# Patient Record
Sex: Male | Born: 1975 | Race: White | Hispanic: No | Smoking: Never smoker
Health system: Southern US, Community
[De-identification: ages and names within clinical notes are randomized; demographics above are authoritative.]

## PROBLEM LIST (undated history)

## (undated) DIAGNOSIS — F32A Depression, unspecified: Secondary | ICD-10-CM

## (undated) DIAGNOSIS — J45909 Unspecified asthma, uncomplicated: Secondary | ICD-10-CM

## (undated) DIAGNOSIS — F1010 Alcohol abuse, uncomplicated: Secondary | ICD-10-CM

## (undated) DIAGNOSIS — F329 Major depressive disorder, single episode, unspecified: Secondary | ICD-10-CM

## (undated) DIAGNOSIS — G4733 Obstructive sleep apnea (adult) (pediatric): Secondary | ICD-10-CM

## (undated) DIAGNOSIS — E785 Hyperlipidemia, unspecified: Secondary | ICD-10-CM

## (undated) HISTORY — DX: Major depressive disorder, single episode, unspecified: F32.9

## (undated) HISTORY — DX: Depression, unspecified: F32.A

## (undated) HISTORY — DX: Unspecified asthma, uncomplicated: J45.909

## (undated) HISTORY — DX: Obstructive sleep apnea (adult) (pediatric): G47.33

## (undated) HISTORY — DX: Alcohol abuse, uncomplicated: F10.10

## (undated) HISTORY — DX: Hyperlipidemia, unspecified: E78.5

## (undated) HISTORY — PX: OTHER SURGICAL HISTORY: SHX169

---

## 2004-09-04 ENCOUNTER — Emergency Department (HOSPITAL_COMMUNITY): Admission: EM | Admit: 2004-09-04 | Discharge: 2004-09-04 | Payer: Self-pay | Admitting: Family Medicine

## 2005-11-29 ENCOUNTER — Emergency Department (HOSPITAL_COMMUNITY): Admission: EM | Admit: 2005-11-29 | Discharge: 2005-11-29 | Payer: Self-pay | Admitting: Family Medicine

## 2006-01-09 ENCOUNTER — Emergency Department (HOSPITAL_COMMUNITY): Admission: EM | Admit: 2006-01-09 | Discharge: 2006-01-09 | Payer: Self-pay | Admitting: Family Medicine

## 2008-05-15 ENCOUNTER — Emergency Department (HOSPITAL_COMMUNITY): Admission: EM | Admit: 2008-05-15 | Discharge: 2008-05-15 | Payer: Self-pay | Admitting: Emergency Medicine

## 2010-12-28 ENCOUNTER — Institutional Professional Consult (permissible substitution) (INDEPENDENT_AMBULATORY_CARE_PROVIDER_SITE_OTHER): Payer: BC Managed Care – PPO | Admitting: Cardiology

## 2010-12-28 DIAGNOSIS — R9431 Abnormal electrocardiogram [ECG] [EKG]: Secondary | ICD-10-CM

## 2010-12-28 DIAGNOSIS — R079 Chest pain, unspecified: Secondary | ICD-10-CM

## 2010-12-29 ENCOUNTER — Other Ambulatory Visit (HOSPITAL_COMMUNITY): Payer: Self-pay | Admitting: Cardiology

## 2010-12-30 ENCOUNTER — Ambulatory Visit (HOSPITAL_COMMUNITY): Payer: BC Managed Care – PPO | Attending: Cardiology | Admitting: Radiology

## 2010-12-30 DIAGNOSIS — R072 Precordial pain: Secondary | ICD-10-CM

## 2010-12-30 DIAGNOSIS — E669 Obesity, unspecified: Secondary | ICD-10-CM | POA: Insufficient documentation

## 2011-01-05 ENCOUNTER — Telehealth: Payer: Self-pay | Admitting: *Deleted

## 2011-01-05 NOTE — Telephone Encounter (Signed)
Notified of echo results; faxed to Dr. Lupe Carney

## 2011-08-12 HISTORY — PX: NASAL SEPTUM SURGERY: SHX37

## 2012-01-12 ENCOUNTER — Encounter: Payer: Self-pay | Admitting: *Deleted

## 2013-03-07 ENCOUNTER — Emergency Department (HOSPITAL_COMMUNITY)
Admission: EM | Admit: 2013-03-07 | Discharge: 2013-03-07 | Disposition: A | Discharge status: Other Acute Inpatient Hospital | Payer: BC Managed Care – PPO | Attending: Emergency Medicine | Admitting: Emergency Medicine

## 2013-03-07 ENCOUNTER — Inpatient Hospital Stay (HOSPITAL_COMMUNITY)
Admission: AD | Admit: 2013-03-07 | Discharge: 2013-03-10 | DRG: 751 | Disposition: A | Discharge status: Home / Self Care | Payer: BC Managed Care – PPO | Source: Intra-hospital | Attending: Psychiatry | Admitting: Psychiatry

## 2013-03-07 ENCOUNTER — Ambulatory Visit (INDEPENDENT_AMBULATORY_CARE_PROVIDER_SITE_OTHER): Payer: BC Managed Care – PPO | Admitting: Family Medicine

## 2013-03-07 ENCOUNTER — Encounter (HOSPITAL_COMMUNITY): Payer: Self-pay | Admitting: *Deleted

## 2013-03-07 ENCOUNTER — Encounter: Payer: Self-pay | Admitting: Family Medicine

## 2013-03-07 VITALS — BP 124/98 | Temp 97.7°F | Ht 77.0 in | Wt 290.0 lb

## 2013-03-07 DIAGNOSIS — F314 Bipolar disorder, current episode depressed, severe, without psychotic features: Secondary | ICD-10-CM

## 2013-03-07 DIAGNOSIS — Z79899 Other long term (current) drug therapy: Secondary | ICD-10-CM | POA: Insufficient documentation

## 2013-03-07 DIAGNOSIS — F1910 Other psychoactive substance abuse, uncomplicated: Secondary | ICD-10-CM

## 2013-03-07 DIAGNOSIS — F1020 Alcohol dependence, uncomplicated: Secondary | ICD-10-CM | POA: Diagnosis present

## 2013-03-07 DIAGNOSIS — F1010 Alcohol abuse, uncomplicated: Secondary | ICD-10-CM | POA: Insufficient documentation

## 2013-03-07 DIAGNOSIS — F332 Major depressive disorder, recurrent severe without psychotic features: Secondary | ICD-10-CM | POA: Diagnosis present

## 2013-03-07 DIAGNOSIS — Z8639 Personal history of other endocrine, nutritional and metabolic disease: Secondary | ICD-10-CM | POA: Insufficient documentation

## 2013-03-07 DIAGNOSIS — F329 Major depressive disorder, single episode, unspecified: Secondary | ICD-10-CM | POA: Insufficient documentation

## 2013-03-07 DIAGNOSIS — Z8659 Personal history of other mental and behavioral disorders: Secondary | ICD-10-CM | POA: Insufficient documentation

## 2013-03-07 DIAGNOSIS — Z862 Personal history of diseases of the blood and blood-forming organs and certain disorders involving the immune mechanism: Secondary | ICD-10-CM | POA: Insufficient documentation

## 2013-03-07 DIAGNOSIS — F32A Depression, unspecified: Secondary | ICD-10-CM

## 2013-03-07 DIAGNOSIS — F3289 Other specified depressive episodes: Secondary | ICD-10-CM | POA: Insufficient documentation

## 2013-03-07 DIAGNOSIS — J45909 Unspecified asthma, uncomplicated: Secondary | ICD-10-CM | POA: Insufficient documentation

## 2013-03-07 DIAGNOSIS — R45851 Suicidal ideations: Secondary | ICD-10-CM

## 2013-03-07 DIAGNOSIS — R259 Unspecified abnormal involuntary movements: Secondary | ICD-10-CM | POA: Insufficient documentation

## 2013-03-07 DIAGNOSIS — F322 Major depressive disorder, single episode, severe without psychotic features: Secondary | ICD-10-CM

## 2013-03-07 LAB — RAPID URINE DRUG SCREEN, HOSP PERFORMED
Amphetamines: NOT DETECTED
Barbiturates: NOT DETECTED
Benzodiazepines: POSITIVE — AB
Cocaine: NOT DETECTED
Opiates: NOT DETECTED
Tetrahydrocannabinol: NOT DETECTED

## 2013-03-07 LAB — CBC
HCT: 50 % (ref 39.0–52.0)
Hemoglobin: 17.8 g/dL — ABNORMAL HIGH (ref 13.0–17.0)
MCH: 29.9 pg (ref 26.0–34.0)
MCHC: 35.6 g/dL (ref 30.0–36.0)
MCV: 84 fL (ref 78.0–100.0)
Platelets: 243 10*3/uL (ref 150–400)
RBC: 5.95 MIL/uL — ABNORMAL HIGH (ref 4.22–5.81)
RDW: 15.1 % (ref 11.5–15.5)
WBC: 8.6 10*3/uL (ref 4.0–10.5)

## 2013-03-07 LAB — ETHANOL: Alcohol, Ethyl (B): <11 mg/dL (ref 0–11)

## 2013-03-07 LAB — COMPREHENSIVE METABOLIC PANEL
ALT: 32 U/L (ref 0–53)
AST: 31 U/L (ref 0–37)
Albumin: 3.5 g/dL (ref 3.5–5.2)
Alkaline Phosphatase: 66 U/L (ref 39–117)
BUN: 15 mg/dL (ref 6–23)
CO2: 28 mEq/L (ref 19–32)
Calcium: 9.6 mg/dL (ref 8.4–10.5)
Chloride: 102 mEq/L (ref 96–112)
Creatinine, Ser: 1.38 mg/dL — ABNORMAL HIGH (ref 0.50–1.35)
GFR calc Af Amer: 75 mL/min — ABNORMAL LOW (ref >90)
GFR calc non Af Amer: 65 mL/min — ABNORMAL LOW (ref >90)
Glucose, Bld: 109 mg/dL — ABNORMAL HIGH (ref 70–99)
Potassium: 4.1 mEq/L (ref 3.5–5.1)
Sodium: 139 mEq/L (ref 135–145)
Total Bilirubin: 0.6 mg/dL (ref 0.3–1.2)
Total Protein: 7.7 g/dL (ref 6.0–8.3)

## 2013-03-07 LAB — ACETAMINOPHEN LEVEL: Acetaminophen (Tylenol), Serum: <15 ug/mL (ref 10–30)

## 2013-03-07 LAB — SALICYLATE LEVEL: Salicylate Lvl: <2 mg/dL — ABNORMAL LOW (ref 2.8–20.0)

## 2013-03-07 MED ORDER — LORAZEPAM 1 MG PO TABS
1.0000 mg | ORAL_TABLET | Freq: Three times a day (TID) | ORAL | Status: DC | PRN
  Administered 2013-03-07: 1 mg via ORAL
  Filled 2013-03-07: qty 1

## 2013-03-07 MED ORDER — CHLORDIAZEPOXIDE HCL 25 MG PO CAPS
25.0000 mg | ORAL_CAPSULE | Freq: Four times a day (QID) | ORAL | Status: AC
  Administered 2013-03-08 (×4): 25 mg via ORAL
  Filled 2013-03-07 (×5): qty 1

## 2013-03-07 MED ORDER — ACETAMINOPHEN 325 MG PO TABS: 650.0000 mg | ORAL_TABLET | ORAL | Status: DC | PRN

## 2013-03-07 MED ORDER — CHLORDIAZEPOXIDE HCL 25 MG PO CAPS: 25.0000 mg | ORAL_CAPSULE | Freq: Four times a day (QID) | ORAL | Status: DC | PRN

## 2013-03-07 MED ORDER — CHLORDIAZEPOXIDE HCL 25 MG PO CAPS: 25.0000 mg | ORAL_CAPSULE | Freq: Every day | ORAL | Status: DC

## 2013-03-07 MED ORDER — CHLORDIAZEPOXIDE HCL 25 MG PO CAPS
25.0000 mg | ORAL_CAPSULE | Freq: Once | ORAL | Status: AC
  Administered 2013-03-07: 25 mg via ORAL
  Filled 2013-03-07: qty 1

## 2013-03-07 MED ORDER — CHLORDIAZEPOXIDE HCL 25 MG PO CAPS
25.0000 mg | ORAL_CAPSULE | Freq: Three times a day (TID) | ORAL | Status: AC
  Filled 2013-03-07 (×2): qty 1

## 2013-03-07 MED ORDER — ONDANSETRON HCL 4 MG PO TABS: 4.0000 mg | ORAL_TABLET | Freq: Three times a day (TID) | ORAL | Status: DC | PRN

## 2013-03-07 MED ORDER — ZOLPIDEM TARTRATE 5 MG PO TABS: 5.0000 mg | ORAL_TABLET | Freq: Every evening | ORAL | Status: DC | PRN

## 2013-03-07 MED ORDER — ONDANSETRON 4 MG PO TBDP
4.0000 mg | ORAL_TABLET | Freq: Four times a day (QID) | ORAL | Status: DC | PRN
  Administered 2013-03-07: 4 mg via ORAL

## 2013-03-07 MED ORDER — ADULT MULTIVITAMIN W/MINERALS CH
1.0000 | ORAL_TABLET | Freq: Every day | ORAL | Status: DC
  Administered 2013-03-08 – 2013-03-10 (×3): 1 via ORAL
  Filled 2013-03-07: qty 4
  Filled 2013-03-07 (×4): qty 1

## 2013-03-07 MED ORDER — TRAZODONE HCL 50 MG PO TABS
50.0000 mg | ORAL_TABLET | Freq: Every evening | ORAL | Status: DC | PRN
  Administered 2013-03-07 – 2013-03-09 (×3): 50 mg via ORAL
  Filled 2013-03-07: qty 1
  Filled 2013-03-07: qty 4
  Filled 2013-03-07: qty 1

## 2013-03-07 MED ORDER — CHLORDIAZEPOXIDE HCL 25 MG PO CAPS: 25.0000 mg | ORAL_CAPSULE | ORAL | Status: DC

## 2013-03-07 MED ORDER — NICOTINE 21 MG/24HR TD PT24: 21.0000 mg | MEDICATED_PATCH | Freq: Every day | TRANSDERMAL | Status: DC

## 2013-03-07 MED ORDER — IBUPROFEN 600 MG PO TABS: 600.0000 mg | ORAL_TABLET | Freq: Three times a day (TID) | ORAL | Status: DC | PRN

## 2013-03-07 MED ORDER — ALUM & MAG HYDROXIDE-SIMETH 200-200-20 MG/5ML PO SUSP: 30.0000 mL | ORAL | Status: DC | PRN

## 2013-03-07 MED ORDER — LOPERAMIDE HCL 2 MG PO CAPS: 2.0000 mg | ORAL_CAPSULE | ORAL | Status: DC | PRN

## 2013-03-07 MED ORDER — THIAMINE HCL 100 MG/ML IJ SOLN
100.0000 mg | Freq: Once | INTRAMUSCULAR | Status: AC
  Administered 2013-03-07: 100 mg via INTRAMUSCULAR

## 2013-03-07 MED ORDER — ACETAMINOPHEN 325 MG PO TABS: 650.0000 mg | ORAL_TABLET | Freq: Four times a day (QID) | ORAL | Status: DC | PRN

## 2013-03-07 MED ORDER — HYDROXYZINE HCL 25 MG PO TABS
25.0000 mg | ORAL_TABLET | Freq: Four times a day (QID) | ORAL | Status: DC | PRN
  Administered 2013-03-08 – 2013-03-10 (×3): 25 mg via ORAL
  Filled 2013-03-07: qty 1

## 2013-03-07 MED ORDER — MAGNESIUM HYDROXIDE 400 MG/5ML PO SUSP: 30.0000 mL | Freq: Every day | ORAL | Status: DC | PRN

## 2013-03-07 MED ORDER — VITAMIN B-1 100 MG PO TABS
100.0000 mg | ORAL_TABLET | Freq: Every day | ORAL | Status: DC
  Administered 2013-03-08 – 2013-03-10 (×3): 100 mg via ORAL
  Filled 2013-03-07 (×5): qty 1

## 2013-03-07 NOTE — BHH Counselor (Signed)
Patient accepted to [facility] by Alvy Beal, NP to Dr. Geoffery Lyons. Pt's room assignment is 300-1. EDP-Dr. Bernette Mayers made aware of patients disposition. Patient's nurse-Sheila also made aware of patient's disposition. The nursing call report # is [phone]. Support paperwork completed and faxed to [facility]. Patient is voluntary and will be transferred to [facility] via hospital security.

## 2013-03-07 NOTE — ED Provider Notes (Signed)
History     CSN: [id number]  Arrival date & time 03/07/13  1029   First MD Initiated Contact with Patient 03/07/13 1217      Chief Complaint  Patient presents with  . Medical Clearance    (Consider location/radiation/quality/duration/timing/severity/associated sxs/prior treatment) HPI Pt is a 37yo male who came in voluntarily today for help with depression.  Reports he has had very high highs and very low lows since he was 19.  Pt reports recent separation in Jan 2014, became tearful talking.  Pt also reports hx of SI 63yrs ago.  Has more recently been thinking about death and suicide while being off of work the past week.  Pt states he has a gun at home he would shoot himself with.  Does not have any SI/HI at this time.  Pt also reports ETOH use, 30 beers/day.  Went 2 days w/o drinking, reports twitches and shakes yesterday but none today.  Today denies n/v, tremors, or feeling like anything crawling on skin.  Pt is very cooperative and calm.    Past Medical History  Diagnosis Date  . Asthma   . Depression   . Hyperlipidemia     Past Surgical History  Procedure Laterality Date  . Torn biceps      surgical repair  . Mrsa      had surgery 4 to 5 yrs ago   . Nasal septum surgery  08/2011    Family History  Problem Relation Age of Onset  . Hypertension Father   . Alcohol abuse Father   . Arthritis Mother   . Lung cancer Maternal Grandfather   . Heart disease Maternal Grandmother   . Hypertension Paternal Grandfather   . Alcohol abuse      all grandparents     History  Substance Use Topics  . Smoking status: Never Smoker   . Smokeless tobacco: Not on file  . Alcohol Use: Yes     Comment: trying to stop drinking daily, estimated 30- beers per day      Review of Systems  Constitutional: Negative for fever and chills.  Respiratory: Negative for cough and shortness of breath.   Cardiovascular: Negative for chest pain.  Gastrointestinal: Negative for nausea,  vomiting and diarrhea.  Neurological: Positive for tremors ( yesterday, not today). Negative for dizziness, seizures, light-headedness, numbness and headaches.  All other systems reviewed and are negative.    Allergies  Review of patient's allergies indicates no known allergies.  Home Medications   Current Outpatient Rx  Name  Route  Sig  Dispense  Refill  . diazepam (VALIUM) 10 MG tablet   Oral   Take 10 mg by mouth at bedtime as needed.          . fish oil-omega-3 fatty acids 1000 MG capsule   Oral   Take 1 g by mouth daily.         . Multiple Vitamin (MULTIVITAMIN) tablet   Oral   Take 1 tablet by mouth daily.         . Nutritional Supplements (PROTEIN SUPPLEMENT 80% PO)   Oral   Take by mouth daily.         . traZODone (DESYREL) 150 MG tablet   Oral   Take 150 mg by mouth at bedtime.            BP 142/98  Pulse 86  Resp 18  SpO2 96%  Physical Exam  Nursing note and vitals reviewed. Constitutional: He appears well-developed  and well-nourished. No distress.  Pt sitting comfortably in exam room. No acute distress.  HENT:  Head: Normocephalic and atraumatic.  Eyes: Conjunctivae are normal. No scleral icterus.  Neck: Normal range of motion.  Cardiovascular: Normal rate, regular rhythm and normal heart sounds.   Pulmonary/Chest: Effort normal and breath sounds normal. No respiratory distress. He has no wheezes. He has no rales. He exhibits no tenderness.  Abdominal: Soft. Bowel sounds are normal. He exhibits no distension and no mass. There is no tenderness. There is no rebound and no guarding.  Musculoskeletal: Normal range of motion.  Neurological: He is alert.  Skin: Skin is warm and dry. He is not diaphoretic.  Psychiatric: His speech is normal and behavior is normal. Judgment and thought content normal. Cognition and memory are normal. He exhibits a depressed mood.    ED Course  Procedures (including critical care time)  Labs Reviewed  CBC -  Abnormal; Notable for the following:    RBC 5.95 (*)    Hemoglobin 17.8 (*)    All other components within normal limits  COMPREHENSIVE METABOLIC PANEL - Abnormal; Notable for the following:    Glucose, Bld 109 (*)    Creatinine, Ser 1.38 (*)    GFR calc non Af Amer 65 (*)    GFR calc Af Amer 75 (*)    All other components within normal limits  SALICYLATE LEVEL - Abnormal; Notable for the following:    Salicylate Lvl <2.0 (*)    All other components within normal limits  URINE RAPID DRUG SCREEN (HOSP PERFORMED) - Abnormal; Notable for the following:    Benzodiazepines POSITIVE (*)    All other components within normal limits  ACETAMINOPHEN LEVEL  ETHANOL   No results found.   1. Depression   2. Alcohol abuse   3. History of suicidal ideation       MDM  Pt voluntarily came to ED for help with depression.  Hx of SI but no SI/HI at this time.  Pt also has hx of alcohol abuse, stopped drinking 2 days ago.  Had tremors yesterday but none today.  No hx of seizures from alcohol withdrawal.Pt denies pain, fever, cough, n/v/d at this time.  Pt cooperative and calm.  Labs: unremarkable.   Pt is medically cleaned.  ACT consulted.  Agreed to speak with pt and determine placement.   Vitals: unremarkable. Discharged in stable condition.    Discussed pt with attending during ED encounter.        Junius Finner, PA-C 03/07/13 1420

## 2013-03-07 NOTE — ED Notes (Signed)
Report given to shelia, rn

## 2013-03-07 NOTE — Progress Notes (Signed)
Patient ID: Nathaniel Bradley, male   DOB: 26-May-1976, 37 y.o.   MRN: [id number] Patient came in with complaints of depression and SI thoughts and has access to guns at home and thoughts of shooting himself; patient reports that he can drink up to 30 beers daily; patient reports that his last drink was 2 days ago and it is an unknown amount; etoh level was negative and uds was negative;patient no history of seizures; patient is separated and states he is not able to see his children as much as he would want to;  patient  Is unhappy at his job and he currently does not have thoughts of SI but contracts for safety; patient denies HI and A/V hallucinations

## 2013-03-07 NOTE — Progress Notes (Signed)
Chief Complaint  Patient presents with  . Establish Care  . Depression    HPI:  Nathaniel Bradley is here to establish care. Recently moved back to [place] [employer].  Last PCP and physical:  Has the following chronic problems and concerns today:  1) Stress/Depression/Anxiety: -related to marital issues, had recent separation from spouse after 10 years this January -has three kids -has been pretty depressed, has had some SI, last SI 3 days ago -had a suicide attempt a few years ago and was hospitalized -has been drinking lately - has been drinking sometimes 30 beers per day to numb feelings -currently denies SI but reports when drinks becomes suicidal - has not a drink in 2 days but feels like needs to drink -has not been drinking the last 2 days, but feels like can't stay away from alcohol -trazadone - takes occasionally for sleep, very rarely uses valium for headaches and stress -professional MMA and boxing, stays in good shape, gets regular exercise  There are no active problems to display for this patient.   Health Maintenance:  ROS: See pertinent positives and negatives per HPI.  Past Medical History  Diagnosis Date  . Asthma   . Depression   . Hyperlipidemia     Family History  Problem Relation Age of Onset  . Hypertension Father   . Alcohol abuse Father   . Arthritis Mother   . Lung cancer Maternal Grandfather   . Heart disease Maternal Grandmother   . Hypertension Paternal Grandfather   . Alcohol abuse      all grandparents     History   Social History  . Marital Status: Legally Separated    Spouse Name: N/A    Number of Children: N/A  . Years of Education: N/A   Social History Main Topics  . Smoking status: Never Smoker   . Smokeless tobacco: None  . Alcohol Use: Yes     Comment: trying to stop drinking daily   . Drug Use: None  . Sexually Active: None   Other Topics Concern  . None   Social History Narrative  . None    Current outpatient  prescriptions:Multiple Vitamin (MULTIVITAMIN) tablet, Take 1 tablet by mouth daily., Disp: , Rfl: ;  Nutritional Supplements (PROTEIN SUPPLEMENT 80% PO), Take by mouth daily., Disp: , Rfl: ;  diazepam (VALIUM) 10 MG tablet, Take 10 mg by mouth at bedtime as needed. , Disp: , Rfl: ;  fish oil-omega-3 fatty acids 1000 MG capsule, Take 1 g by mouth daily., Disp: , Rfl:  traZODone (DESYREL) 150 MG tablet, Take 150 mg by mouth at bedtime. , Disp: , Rfl:   EXAM:  Filed Vitals:   03/07/13 0926  BP: 124/98  Temp: 97.7 F (36.5 C)    Body mass index is 34.38 kg/(m^2).  GENERAL: vitals reviewed and listed above, alert, oriented, appears well hydrated and in no acute distress  HEENT: atraumatic, conjunttiva clear, no obvious abnormalities on inspection of external nose and ears  NECK: no obvious masses on inspection  LUNGS: clear to auscultation bilaterally, no wheezes, rales or rhonchi, good air movement  CV: HRRR, no peripheral edema  MS: moves all extremities without noticeable abnormality  PSYCH: pleasant and cooperative, tearful, cooperative  ASSESSMENT AND PLAN:  Discussed the following assessment and plan:  Severe major depression  Alcoholism /alcohol abuse  -discussed options and I advised inpatient admission and care for alcohol detox and severe depression  -[facility] will not take dual admission -advised ED  for admission to behavioral health offered EMS transport but patient reports he is NOT suicidal or having thoughts of self harm or harm to others currently and will drive himself and feels safe to do so - he agrees to go straight here to the hospital -his mother is here with him and agrees to take him straight to the ED -nurse to notify ED -advised his issues are severe and will need to be managed by psychiatry after hospitalization   -Patient advised to return or notify a doctor immediately if symptoms worsen or persist or new concerns arise.  There are no  Patient Instructions on file for this visit.   Kriste Basque R.

## 2013-03-07 NOTE — ED Provider Notes (Signed)
Medical screening examination/treatment/procedure(s) were performed by non-physician practitioner and as supervising physician I was immediately available for consultation/collaboration.    Anetra Czerwinski R Foye Damron, MD 03/07/13 1612 

## 2013-03-07 NOTE — ED Notes (Addendum)
Pt reports he has been depressed/ had very high highs and then very low lows since he was 37 years old. Pt got separated in Jan 2014. Pt tearful when talking. Reports 10 years ago was SI with idea to shoot himself with a gun. Denies SI at this time. In last week pt has had thoughts of death, SI, shooting himself with a gun. Pt does own a gun. Reports estimated drinks ETOH 30 beers/ day. Has not drank in 2 days. Reports yesterday had twitches and shakes. Today denies n/v, tremors, or feeling like anything is crawling on skin. Pt is very cooperative and calm.   Pt reports he took zoloft 8 years ago.

## 2013-03-07 NOTE — BH Assessment (Signed)
Assessment Note   Nathaniel Bradley is an 37 y.o. male who came in voluntarily today. He has complaints of increased depression and suicidal thoughts. Sts that he has experience depression since age 37 and also suicidal thoughts. Today he reports suicidal ideations, no specific plan today, but unable to contract for safety. Patient also has a gun in his home. He has felt suicidal since last Wednesday and has on/off thoughts of shooting himself. Sts that over the yrs he has made several gestures to shoot himself by sticking a gun in his mouth but never pulling the trigger. Patient was hospitalized 10 yrs ago at [facility] for his suicidal gestures. He does not have current therapist/psychiatrist but received outpatient services in the past for assault charges. His current depression is related to on-going life stressors. His latest stressors include separating from his spouse January 2014. Sts, "My wife is making this separation/divorce hell". Patient became tearful speaking about his separation and also feels sad that his 3 daughters are being affected by this. Patient also unhappy at his job stating he asked for a new position; was given the position; and doesn't like his new position. Patient reports that his depressive symptoms have become so bad that he has not been to work in 7 days. He reports uncontrollable crying spells, fatigue, hopelessness, and loss of interest in daily activities. Patient denies HI.  He reports ETOH use to self medicate his depression, 24-30 beers/day. Patient drinking daily for several yrs. He went 2 days w/o drinking, reports twitches and shakes Monday but none today. No history of seizures or blackouts.    Axis I: Major Depression, Recurrent severe without psychotic features and Alcohol Dependence Axis II: Deferred Axis III:  Past Medical History  Diagnosis Date  . Asthma   . Depression   . Hyperlipidemia    Axis IV: other psychosocial or environmental problems, problems  related to social environment, problems with access to health care services and problems with primary support group Axis V: 31-40 impairment in reality testing  Past Medical History:  Past Medical History  Diagnosis Date  . Asthma   . Depression   . Hyperlipidemia     Past Surgical History  Procedure Laterality Date  . Torn biceps      surgical repair  . Mrsa      had surgery 4 to 5 yrs ago   . Nasal septum surgery  08/2011    Family History:  Family History  Problem Relation Age of Onset  . Hypertension Father   . Alcohol abuse Father   . Arthritis Mother   . Lung cancer Maternal Grandfather   . Heart disease Maternal Grandmother   . Hypertension Paternal Grandfather   . Alcohol abuse      all grandparents     Social History:  reports that he has never smoked. He does not have any smokeless tobacco history on file. He reports that  drinks alcohol. His drug history is not on file.  Additional Social History:  Alcohol / Drug Use Pain Medications: SEE MAR Prescriptions: SEE MAR Over the Counter: SEE MAR History of alcohol / drug use?: Yes Withdrawal Symptoms: Tremors Substance #1 Name of Substance 1: Alcohol  1 - Age of First Use: 37 yrs old 1 - Amount (size/oz): 1 case of beer or "24-30 beers daily" 1 - Frequency: daily 1 - Duration: "several yrs" 1 - Last Use / Amount: 2 days ago  CIWA: CIWA-Ar BP: 142/98 mmHg Pulse Rate: 86 COWS:  Allergies: No Known Allergies  Home Medications:  (Not in a hospital admission)  OB/GYN Status:  No LMP for male patient.  General Assessment Data Location of Assessment: [facility] Living Arrangements: Other (Comment);Non-relatives/Friends (currently residing with a friend) Can pt return to current living arrangement?: Yes Admission Status: Voluntary Is patient capable of signing voluntary admission?: Yes Transfer from: Acute Hospital Referral Source: Self/Family/Friend     Risk to self Suicidal Ideation: Yes-Currently  Present Suicidal Intent: No Is patient at risk for suicide?: No Suicidal Plan?: Yes-Currently Present Specify Current Suicidal Plan:  (shoot self) Access to Means: Yes Specify Access to Suicidal Means:  (sts he is able to find a gun) What has been your use of drugs/alcohol within the last 12 months?:  (patient reports daily alcohol binges) Previous Attempts/Gestures: Yes How many times?:  (pt reports multiple previous gestures) Other Self Harm Risks:  (none reported) Intentional Self Injurious Behavior: None Family Suicide History: No Recent stressful life event(s): Other (Comment);Divorce;Loss (Comment);Conflict (Comment) (separated from spouse 10/2012, new job, 3 children, moved ) Persecutory voices/beliefs?: No Depression: No Depression Symptoms: Feeling angry/irritable;Loss of interest in usual pleasures;Feeling worthless/self pity;Guilt;Fatigue;Isolating;Tearfulness;Insomnia;Despondent Substance abuse history and/or treatment for substance abuse?: No Suicide prevention information given to non-admitted patients: Not applicable  Risk to Others Homicidal Ideation: No Thoughts of Harm to Others: No Current Homicidal Intent: No Current Homicidal Plan: No Access to Homicidal Means: No Identified Victim:  (n/a) History of harm to others?: No Assessment of Violence: None Noted Violent Behavior Description:  (patient is calm, cooperative, pleasant) Does patient have access to weapons?: Yes (Comment) (guns) Criminal Charges Pending?: No Does patient have a court date: No  Psychosis Hallucinations: None noted Delusions: None noted  Mental Status Report Appear/Hygiene: Other (Comment) (appropriate) Eye Contact: Good Motor Activity: Freedom of movement Speech: Logical/coherent Level of Consciousness: Alert Mood: Depressed;Sad Affect: Appropriate to circumstance Anxiety Level: None Judgement: Unimpaired Orientation: Person;Place;Time;Situation Obsessive Compulsive  Thoughts/Behaviors: None  Cognitive Functioning Memory: Recent Intact;Remote Intact IQ: Average Impulse Control: Fair Weight Loss:  (none reported) Weight Gain:  (none reported) Sleep: Decreased Total Hours of Sleep:  (varies) Vegetative Symptoms: None  ADLScreening [facility] Assessment Services) Patient's cognitive ability adequate to safely complete daily activities?: Yes Patient able to express need for assistance with ADLs?: Yes Independently performs ADLs?: Yes (appropriate for developmental age)  Abuse/Neglect [facility]) Physical Abuse: Denies Verbal Abuse: Denies Sexual Abuse: Denies  Prior Inpatient Therapy Prior Inpatient Therapy: Yes Prior Therapy Dates:  (10 yrs ago) Prior Therapy Facilty/Provider(s):  [facility]) Reason for Treatment:  (suicidal and depression)  Prior Outpatient Therapy Prior Outpatient Therapy: Yes Prior Therapy Dates:  (distant past) Prior Therapy Facilty/Provider(s):  ([facility]) Reason for Treatment:  (depression and assault charges)  ADL Screening (condition at time of admission) Patient's cognitive ability adequate to safely complete daily activities?: Yes Patient able to express need for assistance with ADLs?: Yes Independently performs ADLs?: Yes (appropriate for developmental age) Weakness of Legs: None  Home Assistive Devices/Equipment Home Assistive Devices/Equipment: None    Abuse/Neglect Assessment (Assessment to be complete while patient is alone) Physical Abuse: Denies Verbal Abuse: Denies Sexual Abuse: Denies Exploitation of patient/patient's resources: Denies Self-Neglect: Denies Values / Beliefs Cultural Requests During Hospitalization: None Spiritual Requests During Hospitalization: None   Advance Directives (For Healthcare) Advance Directive: Patient does not have advance directive Nutrition Screen- [facility] Adult/[facility] Patient's home diet: Regular  Additional Information 1:1 In Past 12 Months?: No CIRT Risk: No Elopement Risk:  No Does patient have medical clearance?: Yes  Disposition:  Disposition Initial Assessment Completed for this Encounter: Yes Disposition of Patient: Inpatient treatment program;Referred to [facility]) Type of inpatient treatment program: Adult  On Site Evaluation by:   Reviewed with Physician:     Melynda Ripple [facility] 03/07/2013 3:21 PM

## 2013-03-07 NOTE — BHH Counselor (Signed)
Patient referred to [facility]; pending review.

## 2013-03-07 NOTE — Consult Note (Signed)
Reason for Consult:  Major depression, recurrent and SI Referring Physician: EDP  Nathaniel [facility] is an 37 y.o. male.  HPI: Patient states that he has been suffering from depression for a long time.  Patient states that he was in the [facility] for 4 years and was taking a medication for anger management.  States once out of the [facility] he continued to have feelings of depression and his primary medical provider started him on Zoloft.  "The Zoloft made me feel weird so he quit taking.  States that his primary then started him on Valium but it makes him feel tired all the time and feel that it is not working.  States that the Trazodone that he has for sleep makes him feel groggy the next day and he has to take 1/2 a tablet.  Patient states that "I would like to get help so that I can feel like a normal person.  I don't know what else to do.  I turned to the alcohol trying to fix myself but it is not working."  Patient states that he has had one behavioral [facility] admission to [facility] in 2002.  States during his admission was never started on medications.  Patient states that he also saw a therapist last visit was 2002 which he saw 2-3 months after his discharge from [facility].  Patient states that the only medications used to treat his depression has been given by his PCP and that they are not working for him.  Constant thoughts of SI, and daily alcohol consumption.  Patient states that he has also had an issue with anger.  States that he is easily ticked off but has been able to control it.   Past Medical History  Diagnosis Date  . Asthma   . Depression   . Hyperlipidemia     Past Surgical History  Procedure Laterality Date  . Torn biceps      surgical repair  . Mrsa      had surgery 4 to 5 yrs ago   . Nasal septum surgery  08/2011    Family History  Problem Relation Age of Onset  . Hypertension Father   . Alcohol abuse Father   . Arthritis Mother   . Lung cancer Maternal Grandfather   . Heart  disease Maternal Grandmother   . Hypertension Paternal Grandfather   . Alcohol abuse      all grandparents     Social History:  reports that he has never smoked. He does not have any smokeless tobacco history on file. He reports that  drinks alcohol. His drug history is not on file.  Allergies: No Known Allergies  Medications: I have reviewed the patient's current medications.  Results for orders placed during the [facility] encounter of 03/07/13 (from the past 48 hour(s))  URINE RAPID DRUG SCREEN (HOSP PERFORMED)     Status: Abnormal   Collection Time    03/07/13 10:51 AM      Result Value Range   Opiates NONE DETECTED  NONE DETECTED   Cocaine NONE DETECTED  NONE DETECTED   Benzodiazepines POSITIVE (*) NONE DETECTED   Amphetamines NONE DETECTED  NONE DETECTED   Tetrahydrocannabinol NONE DETECTED  NONE DETECTED   Barbiturates NONE DETECTED  NONE DETECTED   Comment:            DRUG SCREEN FOR MEDICAL PURPOSES     ONLY.  IF CONFIRMATION IS NEEDED     FOR ANY PURPOSE, NOTIFY LAB  WITHIN 5 DAYS.                LOWEST DETECTABLE LIMITS     FOR URINE DRUG SCREEN     Drug Class       Cutoff (ng/mL)     Amphetamine      1000     Barbiturate      200     Benzodiazepine   200     Tricyclics       300     Opiates          300     Cocaine          300     THC              50  ACETAMINOPHEN LEVEL     Status: None   Collection Time    03/07/13 10:58 AM      Result Value Range   Acetaminophen (Tylenol), Serum <15.0  10 - 30 ug/mL   Comment:            THERAPEUTIC CONCENTRATIONS VARY     SIGNIFICANTLY. A RANGE OF 10-30     ug/mL MAY BE AN EFFECTIVE     CONCENTRATION FOR MANY PATIENTS.     HOWEVER, SOME ARE BEST TREATED     AT CONCENTRATIONS OUTSIDE THIS     RANGE.     ACETAMINOPHEN CONCENTRATIONS     >150 ug/mL AT 4 HOURS AFTER     INGESTION AND >50 ug/mL AT 12     HOURS AFTER INGESTION ARE     OFTEN ASSOCIATED WITH TOXIC     REACTIONS.  CBC     Status: Abnormal    Collection Time    03/07/13 10:58 AM      Result Value Range   WBC 8.6  4.0 - 10.5 K/uL   RBC 5.95 (*) 4.22 - 5.81 MIL/uL   Hemoglobin 17.8 (*) 13.0 - 17.0 g/dL   HCT 14.7  82.9 - 56.2 %   MCV 84.0  78.0 - 100.0 fL   MCH 29.9  26.0 - 34.0 pg   MCHC 35.6  30.0 - 36.0 g/dL   RDW 13.0  86.5 - 78.4 %   Platelets 243  150 - 400 K/uL  COMPREHENSIVE METABOLIC PANEL     Status: Abnormal   Collection Time    03/07/13 10:58 AM      Result Value Range   Sodium 139  135 - 145 mEq/L   Potassium 4.1  3.5 - 5.1 mEq/L   Chloride 102  96 - 112 mEq/L   CO2 28  19 - 32 mEq/L   Glucose, Bld 109 (*) 70 - 99 mg/dL   BUN 15  6 - 23 mg/dL   Creatinine, Ser 6.96 (*) 0.50 - 1.35 mg/dL   Calcium 9.6  8.4 - 29.5 mg/dL   Total Protein 7.7  6.0 - 8.3 g/dL   Albumin 3.5  3.5 - 5.2 g/dL   AST 31  0 - 37 U/L   ALT 32  0 - 53 U/L   Alkaline Phosphatase 66  39 - 117 U/L   Total Bilirubin 0.6  0.3 - 1.2 mg/dL   GFR calc non Af Amer 65 (*) >90 mL/min   GFR calc Af Amer 75 (*) >90 mL/min   Comment:            The eGFR has been calculated     using the CKD EPI equation.  This calculation has not been     validated in all clinical     situations.     eGFR's persistently     <90 mL/min signify     possible Chronic Kidney Disease.  ETHANOL     Status: None   Collection Time    03/07/13 10:58 AM      Result Value Range   Alcohol, Ethyl (B) <11  0 - 11 mg/dL   Comment:            LOWEST DETECTABLE LIMIT FOR     SERUM ALCOHOL IS 11 mg/dL     FOR MEDICAL PURPOSES ONLY  SALICYLATE LEVEL     Status: Abnormal   Collection Time    03/07/13 10:58 AM      Result Value Range   Salicylate Lvl <2.0 (*) 2.8 - 20.0 mg/dL    No results found.  Review of Systems  Constitutional: Negative.   HENT: Negative.   Eyes: Negative.   Respiratory: Negative.   Cardiovascular: Negative.   Gastrointestinal: Negative.   Genitourinary: Negative.   Musculoskeletal: Negative.   Skin: Negative for itching and rash.        Tattoos bilaterally upper ext.   Neurological: Positive for tremors.  Endo/Heme/Allergies: Negative.   Psychiatric/Behavioral: Positive for depression, suicidal ideas and substance abuse. Negative for hallucinations and memory loss. The patient is nervous/anxious and has insomnia.        Patient states that he drinks alcohol daily.  Last dirnk Monday.   Patient states that he has the tremors but not as bad as were on Monday.  SI thoughts to shoot self in head.   Stressors: bad separation/divorce, being apart from children and work   Blood pressure 142/98, pulse 86, resp. rate 18, SpO2 96.00%. Physical Exam  Constitutional: He is oriented to person, place, and time. He appears well-developed and well-nourished.  HENT:  Head: Normocephalic and atraumatic.  Eyes: Conjunctivae are normal. Pupils are equal, round, and reactive to light.  Neck: Normal range of motion. Neck supple.  Cardiovascular: Normal rate, regular rhythm and normal heart sounds.   Respiratory: Effort normal and breath sounds normal.  GI: Soft. Bowel sounds are normal.  Musculoskeletal: Normal range of motion.  Neurological: He is alert and oriented to person, place, and time.  Skin: Skin is warm and dry.  Psychiatric: His speech is normal and behavior is normal. His mood appears anxious. Thought content is not paranoid. Cognition and memory are normal. He exhibits a depressed mood. He expresses suicidal ideation. He expresses no homicidal ideation. He expresses suicidal plans.    Assessment/Plan:  Consulted Dr. Elsie Saas Recommendation:  In patient treatment.  Accepted to  [facility] waiting on Bed assignment.  Dresden Ament 03/07/2013, 4:39 PM

## 2013-03-07 NOTE — ED Notes (Signed)
C/o anxiety/medicated 

## 2013-03-07 NOTE — Tx Team (Signed)
Initial Interdisciplinary Treatment Plan  PATIENT STRENGTHS: (choose at least two) Average or above average intelligence Capable of independent living Financial means Physical Health  PATIENT STRESSORS: Substance abuse   PROBLEM LIST: Problem List/Patient Goals Date to be addressed Date deferred Reason deferred Estimated date of resolution  Suicidal ideation 03/07/2013     depression 03/07/2013     Substance abuse 03/07/2013                                          DISCHARGE CRITERIA:  Ability to meet basic life and health needs Improved stabilization in mood, thinking, and/or behavior Need for constant or close observation no longer present Withdrawal symptoms are absent or subacute and managed without 24-hour nursing intervention  PRELIMINARY DISCHARGE PLAN: Attend aftercare/continuing care group Return to previous living arrangement  PATIENT/FAMIILY INVOLVEMENT: This treatment plan has been presented to and reviewed with the patient, [facility].  The patient and family have been given the opportunity to ask questions and make suggestions.  Leighton Parody M 03/07/2013, 7:01 PM

## 2013-03-07 NOTE — ED Notes (Signed)
Report called to Production manager at [facility]. Awaiting transport.

## 2013-03-07 NOTE — Consult Note (Signed)
Reviewed the information documented and agree with the treatment plan.  Lisandro Meggett,JANARDHAHA R. 03/07/2013 6:02 PM

## 2013-03-08 ENCOUNTER — Encounter (HOSPITAL_COMMUNITY): Payer: Self-pay | Admitting: Psychiatry

## 2013-03-08 DIAGNOSIS — F314 Bipolar disorder, current episode depressed, severe, without psychotic features: Secondary | ICD-10-CM

## 2013-03-08 MED ORDER — BENAZEPRIL HCL 10 MG PO TABS
10.0000 mg | ORAL_TABLET | Freq: Every day | ORAL | Status: DC
  Administered 2013-03-09 – 2013-03-10 (×2): 10 mg via ORAL
  Filled 2013-03-08 (×3): qty 1
  Filled 2013-03-08: qty 8
  Filled 2013-03-08: qty 1

## 2013-03-08 MED ORDER — BENAZEPRIL HCL 20 MG PO TABS
20.0000 mg | ORAL_TABLET | Freq: Once | ORAL | Status: AC
  Administered 2013-03-08: 20 mg via ORAL
  Filled 2013-03-08: qty 1
  Filled 2013-03-08: qty 2

## 2013-03-08 MED ORDER — CARBAMAZEPINE ER 200 MG PO TB12
200.0000 mg | ORAL_TABLET | Freq: Two times a day (BID) | ORAL | Status: DC
  Administered 2013-03-08 – 2013-03-10 (×4): 200 mg via ORAL
  Filled 2013-03-08 (×3): qty 1
  Filled 2013-03-08: qty 8
  Filled 2013-03-08: qty 1
  Filled 2013-03-08: qty 8
  Filled 2013-03-08 (×2): qty 1

## 2013-03-08 MED ORDER — LORATADINE 10 MG PO TABS
10.0000 mg | ORAL_TABLET | Freq: Every day | ORAL | Status: DC
  Administered 2013-03-08 – 2013-03-10 (×3): 10 mg via ORAL
  Filled 2013-03-08 (×6): qty 1

## 2013-03-08 MED ORDER — TRAZODONE HCL 150 MG PO TABS: 150.0000 mg | ORAL_TABLET | Freq: Every day | ORAL | Status: DC

## 2013-03-08 NOTE — [facility] Suicide Risk Assessment (Signed)
Suicide Risk Assessment  Admission Assessment     Nursing information obtained from:  Patient Demographic factors:  Male;Adolescent or young adult;Divorced or widowed;Caucasian;Living alone;Access to firearms Current Mental Status:  Self-harm thoughts Loss Factors:  Loss of significant relationship Historical Factors:  Prior suicide attempts Risk Reduction Factors:  Responsible for children under 37 years of age;Sense of responsibility to family  CLINICAL FACTORS:   Bipolar Disorder:   Depressive phase Alcohol/Substance Abuse/Dependencies  COGNITIVE FEATURES THAT CONTRIBUTE TO RISK:  Closed-mindedness Polarized thinking Thought constriction (tunnel vision)    SUICIDE RISK:   Moderate:  Frequent suicidal ideation with limited intensity, and duration, some specificity in terms of plans, no associated intent, good self-control, limited dysphoria/symptomatology, some risk factors present, and identifiable protective factors, including available and accessible social support.  PLAN OF CARE: Supportive approach/coping skills/relapse prevention                               Reassess and address the comorbidities  I certify that inpatient services furnished can reasonably be expected to improve the patient's condition.  Hassaan Crite A 03/08/2013, 3:42 PM

## 2013-03-08 NOTE — [facility] Counselor (Signed)
Adult Comprehensive Assessment  Patient ID: Nathaniel [facility], male   DOB: 03/01/1976, 37 y.o.   MRN: [id number]  Information Source: Information source: Patient  Current Stressors:  Educational / Learning stressors: NA Employment / Job issues: NA Family Relationships: Strained with wife and children and brothers Surveyor, quantity / Lack of resources (include bankruptcy): "Tight" Housing / Lack of housing: Strained due to separation, has been staying with various friends for last 5 months Physical health (include injuries & life threatening diseases): NA Social relationships: NA Substance abuse: Alcohol use Bereavement / Loss: NA  Living/Environment/Situation:  Living Arrangements: Non-relatives/Friends Living conditions (as described by patient or guardian): Staying with friends since separation from wife in January How long has patient lived in current situation?: 5 months What is atmosphere in current home: Temporary  Family History:  Marital status: Separated Separated, when?: January 2014 What types of issues is patient dealing with in the relationship?: "We never really got along, she got pregnant in first month we were dating.  Separated many times, usually not this long." Additional relationship information: Married 10 years Does patient have children?: Yes How many children?: 3 How is patient's relationship with their children?: Good with 12 YO, don't see 30 and 37 yo as often as would like"  Childhood History:  By whom was/is the patient raised?: Both parents Additional childhood history information: "Grew up poor, mother was 2 YO when she had me and parents were not educated very well." Description of patient's relationship with caregiver when they were a child: Okay with both Patient's description of current relationship with people who raised him/her: Good with both Does patient have siblings?: Yes Number of Siblings: 2 Description of patient's current relationship with  siblings: "Both brothers have succeeded in life really well, not me; wish I had the opportunity to do life over, I'd be better student." Patient has minimal contact with brothers, last saw years ago Did patient suffer any verbal/emotional/physical/sexual abuse as a child?: No Did patient suffer from severe childhood neglect?: No Has patient ever been sexually abused/assaulted/raped as an adolescent or adult?: No Was the patient ever a victim of a crime or a disaster?: No Witnessed domestic violence?: No Has patient been effected by domestic violence as an adult?: No  Education:  Highest grade of school patient has completed: 12th Currently a student?: No Learning disability?: No  Employment/Work Situation:   Employment situation: Employed Where is patient currently employed?: [employer] How long has patient been employed?: 5 years Patient's job has been impacted by current illness: Yes, missed last week of work due to depression. What is the longest time patient has a held a job?: 5 years Where was the patient employed at that time?: Current job Has patient ever been in the [employer]?: Yes (Describe in comment) Field seismologist, 4 years, discharged for "personality disorder") Has patient ever served in combat?: No  Financial Resources:   Surveyor, quantity resources: Income from employment Does patient have a representative payee or guardian?: No  Alcohol/Substance Abuse:   What has been your use of drugs/alcohol within the last 12 months?: If not working (patient works 12 hour shifts) patient will drink 24-30 beers daily, if working 6-12 If attempted suicide, did drugs/alcohol play a role in this?:  (Not applicable) Alcohol/Substance Abuse Treatment Hx: Denies past history Has alcohol/substance abuse ever caused legal problems?: Yes (Physical Altercations)  Social Support System:   Patient's Community Support System: Fair Describe Community Support System: parents and uncle Type of faith/religion:  NA How does  patient's faith help to cope with current illness?: NA  Leisure/Recreation:   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  Strengths/Needs:   What things does the patient do well?: Uncertain In what areas does patient struggle / problems for patient: Alcohol, marriage and job stress  Discharge Plan:   Does patient have access to transportation?: Yes Will patient be returning to same living situation after discharge?: No Plan for living situation after discharge: Probably will go to live with parents for a while as they have offered Currently receiving community mental health services: No If no, would patient like referral for services when discharged?: Yes (What county?) Medical sales representative) Does patient have financial barriers related to discharge medications?: No  Summary/Recommendations:   Summary and Recommendations (to be completed by the evaluator): Patient is 37 YO separated employed caucasian male admitted with diagnosis of  Major Depression, Recurrent severe without psychotic features and Alcohol Dependence. Patient would benefit from crisis stabilization, medication evaluation, therapy groups for processing thoughts/feelings/experiences, psycho ed groups for coping skills, and case management for discharge planning    Clide Dales. 03/08/2013

## 2013-03-08 NOTE — Progress Notes (Signed)
Patient ID: [facility], male   DOB: 03-Dec-1975, 37 y.o.   MRN: [id number] He has been to groups. Has interacted very little with peers and staff He speakes in a low soft voice. Denies SI thoughts.  He requested and received prn for anxiety this AM and he said that it helped.

## 2013-03-08 NOTE — [facility] Group Notes (Signed)
[facility] LCSW Group Therapy  03/08/2013 1:15 PM  Type of Therapy:  Group Therapy 1:15 to 2:30 PM  Participation Level: Did Not Attend  [facility]

## 2013-03-08 NOTE — Progress Notes (Signed)
Patient denied SI and HI.  Denied A/V hallucinations.  Stated he does not enjoy groups, people sit around and brag about their drug use.

## 2013-03-08 NOTE — Progress Notes (Signed)
Recreation Therapy Notes  Date: 05.29.2014 Time: 3:00pm  Location: [address] Dayroom  Group Topic/Focus: Stress Managment   Participation Level:  Did not attend  [employer], LRT/ CTRS  Loren Sawaya L 03/08/2013 4:25 PM

## 2013-03-08 NOTE — [facility] Group Notes (Signed)
[facility] LCSW Aftercare Discharge Planning Group Note   03/08/2013 8:45 AM  Participation Quality:  Minimal, Patient refused to participate but did remain in group room for entire group when asked to stay.   Mood/Affect:  Blunted/Flat  Patient reports later in day that he was simply irritated by other patients talking before group about wanting to get high verses wanting to get well.  Clide Dales

## 2013-03-08 NOTE — Progress Notes (Signed)
Adult Psychoeducational Group Note  Date:  03/08/2013 Time:  1:50 PM  Group Topic/Focus:  Overcoming Stress:   The focus of this group is to define stress and help patients assess their triggers.  Participation Level:  Did Not Attend  Participation Quality:       Affect:    Cognitive:   Insight:  Engagement in Group:    Modes of Intervention:    Additional Comments:  Pt did not attend group.  Shelly Bombard D 03/08/2013, 1:50 PM

## 2013-03-08 NOTE — H&P (Signed)
Psychiatric Admission Assessment Adult  Patient Identification:  Nathaniel Bradley  Date of Evaluation:  03/08/2013  Chief Complaint:  MAJOR DEPRESIVE DISORDER, ETOH DEPENDENCE  History of Present Illness: This is a 37 year old Caucasian male. Admitted to [facility] from the [facility] ED with complaints of increased depression, excessive alcohol consumption and constant suicidal thoughts. Patient reports, "My mother took me to the hospital yesterday. I was having some issues. And while at the hospital yesterday, some doctors informed me that my symptoms sound more like bipolar disorder symptoms. The lower end of what I have been feeling is really bad. I have extreme feelings of ups and downs. One minute, I'm the life of a party, then the next minute, is an uncontrollable crying spells. This feelings has been going  On x 10 years or more. I have taken medication here and there, but never felt any better. I had taken Zoloft, which made me feel worse. I'm currently taken Trazodone, which is making me feel very groggy in the mornings. 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 And because of the way that I have been feeling, I have not gone to work in 7 days. I have been drinking quite heavily 24-30 bottles of beer daily. I have constant thoughts of suicide. I remembered being in the [place] and feeling like my world was doomed and like life sucks. I have tried on several occasions by putting a gun in my mouth, but never pulled the trigger. I don't cut myself. In 2002, I got very depressed after getting out of the [employer], I threatened to kill myself with a gun. My parents freaked out and called the cops. I was taken to [facility] then. I had abuse cocaine and marijuana in past, but not recently. I only abuse alcohol now. I want get better. I have 3 beautiful daughters that I miss greatly".  Elements:  Location:  [facility] adult unit. Quality:  Extreme feelings of highs/lows, crying spells, suicidal thoughts, increased alcohol  consumption. Severity:  Rated anxiety at #9, and depression at #3. Timing:  "My symptoms worsened over the last few months". Duration:  "I have been this way since I was 19" Context:  Uncontrollable crying, Excessive alcohol drinking, suicidal thoughts/threats, extreme mood swings.  Associated Signs/Synptoms:  Depression Symptoms:  depressed mood, insomnia, feelings of worthlessness/guilt, hopelessness, suicidal thoughts with specific plan, anxiety,  (Hypo) Manic Symptoms:  Irritable Mood, Labiality of Mood,  Anxiety Symptoms:  Excessive Worry,  Psychotic Symptoms:  Hallucinations: Denies  PTSD Symptoms: Had a traumatic exposure:  None  Psychiatric Specialty Exam: Physical Exam  Constitutional: He is oriented to person, place, and time. He appears well-developed.  Obese  HENT:  Head: Normocephalic.  Eyes: Pupils are equal, round, and reactive to light.  Neck: Normal range of motion.  Cardiovascular: Normal rate.   Respiratory: Effort normal.  GI: Soft.  Musculoskeletal: Normal range of motion.  Neurological: He is alert and oriented to person, place, and time.  Skin: Skin is warm and dry.  Multiple bodily tattoos to arm areas   Psychiatric: His speech is normal and behavior is normal. Judgment normal. His mood appears anxious (rated at #9). His affect is angry. His affect is not blunt, not labile and not inappropriate. Cognition and memory are normal. He exhibits a depressed mood (Rated at #3). He expresses suicidal ideation. He expresses no suicidal plans.    Review of Systems  Constitutional: Negative.   HENT: Negative.   Eyes: Negative.   Respiratory:  Negative.   Cardiovascular: Negative.   Gastrointestinal: Negative.   Genitourinary: Negative.   Musculoskeletal: Negative.   Skin: Negative for itching and rash.       Numerous tattoos to arm areas   Neurological: Negative.   Endo/Heme/Allergies: Negative.   Psychiatric/Behavioral: Positive for depression  (Rated depression at #3), suicidal ideas and substance abuse (Hx cocaine/THC abuse). Negative for hallucinations and memory loss. The patient is nervous/anxious (Rated anxiety at #9) and has insomnia.     Blood pressure 144/100, pulse 80, temperature 98.3 F (36.8 C), temperature source Oral, resp. rate 18, height 6\' 4"  (1.93 m), weight 131.543 kg (290 lb), SpO2 98.00%.Body mass index is 35.31 kg/(m^2).  General Appearance: Disheveled and Obese  Eye Contact::  Fair  Speech:  Clear and Coherent and Normal Rate  Volume:  Normal  Mood:  Anxious, Depressed and Dysphoric  Affect:  Congruent and Tearful  Thought Process:  Coherent, Goal Directed and Intact  Orientation:  Full (Time, Place, and Person)  Thought Content:  Rumination  Suicidal Thoughts:  No, but present on admission.  Homicidal Thoughts:  No  Memory:  Immediate;   Good Recent;   Good Remote;   Good  Judgement:  Fair  Insight:  Good  Psychomotor Activity:  Anxious  Concentration:  Fair  Recall:  Good  Akathisia:  No  Handed:  Right  AIMS (if indicated):     Assets:  Communication Skills Desire for Improvement Physical Health Social Support  Sleep:  Number of Hours: 6.5    Past Psychiatric History: Diagnosis: Bipolar affective disorder, depressed, Alcohol dependence, Hx. Cocaine abuse.  Hospitalizations: [place] 2002, [facility]  Outpatient Care: [facility]  Substance Abuse Care: None reported  Self-Mutilation: Denies  Suicidal Attempts: Denies attempts, admits thoughts.  Violent Behaviors: None reported   Past Medical History:   Past Medical History  Diagnosis Date  . Asthma   . Depression   . Hyperlipidemia    Cardiac History:  HTN, Asthma  Allergies:  No Known Allergies  PTA Medications: Prescriptions prior to admission  Medication Sig Dispense Refill  . diazepam (VALIUM) 10 MG tablet Take 10 mg by mouth at bedtime as needed.       . fish oil-omega-3 fatty acids 1000 MG capsule Take 1 g by mouth  daily.      . Multiple Vitamin (MULTIVITAMIN) tablet Take 1 tablet by mouth daily.      . Nutritional Supplements (PROTEIN SUPPLEMENT 80% PO) Take by mouth daily.      . traZODone (DESYREL) 150 MG tablet Take 150 mg by mouth at bedtime.         Previous Psychotropic Medications:  Medication/Dose  See medication lists               Substance Abuse History in the last 12 months:  yes  Consequences of Substance Abuse: Medical Consequences:  Liver damage, Possible death by overdose Legal Consequences:  Arrests, jail time, Loss of driving privilege. Family Consequences:  Family discord, divorce and or separation.  Social History:  reports that he has never smoked. He does not have any smokeless tobacco history on file. He reports that  drinks alcohol. His drug history is not on file. Additional Social History:  Current Place of Residence: [place], [place]    Place of Birth: [place], [place]  Family Members: "My 3 children"  Marital Status:  Separated  Children: 3  Sons: 0  Daughters: 3  Relationships: Separated  Education:  [employer]  Problems/Performance: Completed high school  Religious Beliefs/Practices: NA  History of Abuse (Emotional/Phsycial/Sexual): Denies  Occupational Experiences: Employed  Hotel manager History:  Chiropodist History: Currently going through divorce proceedings.  Hobbies/Interests: None reported  Family History:   Family History  Problem Relation Age of Onset  . Hypertension Father   . Alcohol abuse Father   . Arthritis Mother   . Lung cancer Maternal Grandfather   . Heart disease Maternal Grandmother   . Hypertension Paternal Grandfather   . Alcohol abuse      all grandparents     Results for orders placed during the hospital encounter of 03/07/13 (from the past 72 hour(s))  URINE RAPID DRUG SCREEN (HOSP PERFORMED)     Status: Abnormal   Collection Time    03/07/13 10:51 AM      Result Value Range   Opiates NONE  DETECTED  NONE DETECTED   Cocaine NONE DETECTED  NONE DETECTED   Benzodiazepines POSITIVE (*) NONE DETECTED   Amphetamines NONE DETECTED  NONE DETECTED   Tetrahydrocannabinol NONE DETECTED  NONE DETECTED   Barbiturates NONE DETECTED  NONE DETECTED   Comment:            DRUG SCREEN FOR MEDICAL PURPOSES     ONLY.  IF CONFIRMATION IS NEEDED     FOR ANY PURPOSE, NOTIFY LAB     WITHIN 5 DAYS.                LOWEST DETECTABLE LIMITS     FOR URINE DRUG SCREEN     Drug Class       Cutoff (ng/mL)     Amphetamine      1000     Barbiturate      200     Benzodiazepine   200     Tricyclics       300     Opiates          300     Cocaine          300     THC              50  ACETAMINOPHEN LEVEL     Status: None   Collection Time    03/07/13 10:58 AM      Result Value Range   Acetaminophen (Tylenol), Serum <15.0  10 - 30 ug/mL   Comment:            THERAPEUTIC CONCENTRATIONS VARY     SIGNIFICANTLY. A RANGE OF 10-30     ug/mL MAY BE AN EFFECTIVE     CONCENTRATION FOR MANY PATIENTS.     HOWEVER, SOME ARE BEST TREATED     AT CONCENTRATIONS OUTSIDE THIS     RANGE.     ACETAMINOPHEN CONCENTRATIONS     >150 ug/mL AT 4 HOURS AFTER     INGESTION AND >50 ug/mL AT 12     HOURS AFTER INGESTION ARE     OFTEN ASSOCIATED WITH TOXIC     REACTIONS.  CBC     Status: Abnormal   Collection Time    03/07/13 10:58 AM      Result Value Range   WBC 8.6  4.0 - 10.5 K/uL   RBC 5.95 (*) 4.22 - 5.81 MIL/uL   Hemoglobin 17.8 (*) 13.0 - 17.0 g/dL   HCT 16.1  09.6 - 04.5 %   MCV 84.0  78.0 - 100.0 fL   MCH 29.9  26.0 - 34.0 pg  MCHC 35.6  30.0 - 36.0 g/dL   RDW 16.1  09.6 - 04.5 %   Platelets 243  150 - 400 K/uL  COMPREHENSIVE METABOLIC PANEL     Status: Abnormal   Collection Time    03/07/13 10:58 AM      Result Value Range   Sodium 139  135 - 145 mEq/L   Potassium 4.1  3.5 - 5.1 mEq/L   Chloride 102  96 - 112 mEq/L   CO2 28  19 - 32 mEq/L   Glucose, Bld 109 (*) 70 - 99 mg/dL   BUN 15  6 - 23  mg/dL   Creatinine, Ser 4.09 (*) 0.50 - 1.35 mg/dL   Calcium 9.6  8.4 - 81.1 mg/dL   Total Protein 7.7  6.0 - 8.3 g/dL   Albumin 3.5  3.5 - 5.2 g/dL   AST 31  0 - 37 U/L   ALT 32  0 - 53 U/L   Alkaline Phosphatase 66  39 - 117 U/L   Total Bilirubin 0.6  0.3 - 1.2 mg/dL   GFR calc non Af Amer 65 (*) >90 mL/min   GFR calc Af Amer 75 (*) >90 mL/min   Comment:            The eGFR has been calculated     using the CKD EPI equation.     This calculation has not been     validated in all clinical     situations.     eGFR's persistently     <90 mL/min signify     possible Chronic Kidney Disease.  ETHANOL     Status: None   Collection Time    03/07/13 10:58 AM      Result Value Range   Alcohol, Ethyl (B) <11  0 - 11 mg/dL   Comment:            LOWEST DETECTABLE LIMIT FOR     SERUM ALCOHOL IS 11 mg/dL     FOR MEDICAL PURPOSES ONLY  SALICYLATE LEVEL     Status: Abnormal   Collection Time    03/07/13 10:58 AM      Result Value Range   Salicylate Lvl <2.0 (*) 2.8 - 20.0 mg/dL   Psychological Evaluations:  Assessment:   AXIS I:  Alcohol dependence, Bipolar affective disorder, depressed, severe, Hx cocaine abuse AXIS II:  Deferred AXIS III:   Past Medical History  Diagnosis Date  . Asthma   . Depression   . Hyperlipidemia    AXIS IV:  other psychosocial or environmental problems and marital discord AXIS V:  11-20 some danger of hurting self or others possible OR occasionally fails to maintain minimal personal hygiene OR gross impairment in communication  Treatment Plan/Recommendations: 1. Admit for crisis management and stabilization, estimated length of stay 3-5 days.  2. Medication management to reduce current symptoms to base line and improve the patient's overall level of functioning (a). Carbamazepine XR 200 mg bid for mood stabilization.                                (b). Obtain Tegretol levels on 03/10/13 am. 3. Treat health problems as indicated. (a). Benazepril 20 mg  once, and 10 mg daily starting 03/09/13.                                                               (  b). Claritin 10 mg daily for sinus allergies. 4. Develop treatment plan to decrease risk of relapse upon discharge and the need for readmission.  5. Psycho-social education regarding relapse prevention and self care.  6. Health care follow up as needed for medical problems.  7. Review, reconcile, and reinstate any pertinent home medications for other health issues where appropriate. 8. Call for consults with hospitalist for any additional specialty patient care services as needed.  Treatment Plan Summary: Daily contact with patient to assess and evaluate symptoms and progress in treatment Medication management Supportive approach/coping skills/relapse prevention plan  Identify need for detox Reassess and address the comorbidities Current Medications:  Current Facility-Administered Medications  Medication Dose Route Frequency Provider Last Rate Last Dose  . acetaminophen (TYLENOL) tablet 650 mg  650 mg Oral Q6H PRN Shuvon Rankin, NP      . alum & mag hydroxide-simeth (MAALOX/MYLANTA) 200-200-20 MG/5ML suspension 30 mL  30 mL Oral Q4H PRN Shuvon Rankin, NP      . chlordiazePOXIDE (LIBRIUM) capsule 25 mg  25 mg Oral Q6H PRN Shuvon Rankin, NP      . chlordiazePOXIDE (LIBRIUM) capsule 25 mg  25 mg Oral QID Shuvon Rankin, NP   25 mg at 03/08/13 0803   Followed by  . [START ON 03/09/2013] chlordiazePOXIDE (LIBRIUM) capsule 25 mg  25 mg Oral TID Shuvon Rankin, NP       Followed by  . [START ON 03/10/2013] chlordiazePOXIDE (LIBRIUM) capsule 25 mg  25 mg Oral BH-qamhs Shuvon Rankin, NP       Followed by  . [START ON 03/11/2013] chlordiazePOXIDE (LIBRIUM) capsule 25 mg  25 mg Oral Daily Shuvon Rankin, NP      . hydrOXYzine (ATARAX/VISTARIL) tablet 25 mg  25 mg Oral Q6H PRN Shuvon Rankin, NP   25 mg at 03/08/13 0804  . loperamide (IMODIUM) capsule 2-4 mg  2-4 mg Oral PRN Shuvon Rankin, NP      .  magnesium hydroxide (MILK OF MAGNESIA) suspension 30 mL  30 mL Oral Daily PRN Shuvon Rankin, NP      . multivitamin with minerals tablet 1 tablet  1 tablet Oral Daily Shuvon Rankin, NP   1 tablet at 03/08/13 0803  . ondansetron (ZOFRAN-ODT) disintegrating tablet 4 mg  4 mg Oral Q6H PRN Shuvon Rankin, NP   4 mg at 03/07/13 2126  . thiamine (VITAMIN B-1) tablet 100 mg  100 mg Oral Daily Shuvon Rankin, NP   100 mg at 03/08/13 0803  . traZODone (DESYREL) tablet 50 mg  50 mg Oral QHS PRN Shuvon Rankin, NP   50 mg at 03/07/13 2126    Observation Level/Precautions:  15 minute checks  Laboratory:  Reviewed ED lab findings on file  Psychotherapy: Group sessions    Medications: See medication lists   Consultations:  As needed  Discharge Concerns:  Safety/sobriety  Estimated LOS: 3-5 days  Other:     I certify that inpatient services furnished can reasonably be expected to improve the patient's condition.   Sanjuana Kava, PMHNP-BC 5/29/20149:02 AM

## 2013-03-08 NOTE — Progress Notes (Signed)
D: Patient in his room during this assessment. He had a lady visiting. Patient stated he is doing better than yesterday. Mood and affects appropriate. He denied SI/HI, denied hallucinations and denied withdrawal symptoms.  A: Writer encouraged and supported patient. R: Patient receptive to encouragement and support. Q 15 minute check continues as ordered to maintain safety.

## 2013-03-09 DIAGNOSIS — F39 Unspecified mood [affective] disorder: Secondary | ICD-10-CM

## 2013-03-09 MED ORDER — NALTREXONE HCL 50 MG PO TABS
25.0000 mg | ORAL_TABLET | Freq: Every day | ORAL | Status: DC
  Administered 2013-03-09 – 2013-03-10 (×2): 25 mg via ORAL
  Filled 2013-03-09 (×3): qty 1
  Filled 2013-03-09: qty 2

## 2013-03-09 NOTE — Progress Notes (Signed)
Adult Psychoeducational Group Note  Date:  03/09/2013 Time:  8:16 PM  Group Topic/Focus:  Goals Group:   The focus of this group is to help patients establish daily goals to achieve during treatment and discuss how the patient can incorporate goal setting into their daily lives to aide in recovery.  Participation Level:  None  Participation Quality:  Didnt attend  Affect:  didnt attend  Cognitive:  didnt attend  Insight: None  Engagement in Group:  didnt attend  Modes of Intervention:  didnt attend  Additional Comments:  Didn't attend was in bed sleep  Aldona Lento 03/09/2013, 8:16 PM

## 2013-03-09 NOTE — Progress Notes (Signed)
Patient ID: Nathaniel Bradley, male   DOB: Jun 25, 1976, 37 y.o.   MRN: [id number]  D: Patient has a flat affect on approach today. Reports continued anxiety tonight. States he still has issues with his mood and feels like he gets angry fast. States that he did have a bad phone call that made him worse tonight. We talked about how medication takes time to help and that it is normal to get angry with situational issues. Patient got some vistaril prn tonight. Denies any SI/HI. A: Staff will monitor on q 15 minute checks, follow treatment plan, and give meds as ordered. R: Cooperative on unit tonight.

## 2013-03-09 NOTE — Tx Team (Signed)
Interdisciplinary Treatment Plan Update (Adult)  Date: 03/09/2013  Time Reviewed: 9:50 AM   Progress in Treatment: Attending groups: Yes Participating in groups: Yes Taking medication as prescribed:  Yes Tolerating medication:  Yes Family/Significant othe contact made: Not as yet Patient understands diagnosis: Yes Discussing patient identified problems/goals with staff: Yes Medical problems stabilized or resolved:  Yes Denies suicidal/homicidal ideation: Yes Patient has not harmed self or Others: Yes  New problem(s) identified: None Identified  Discharge Plan or Barriers:  CSW is assessing for appropriate referrals.   Additional comments: N/A  Reason for Continuation of Hospitalization: Withdrawal symptoms   Estimated length of stay: 3 days  For review of initial/current patient goals, please see plan of care.  Attendees: Patient:     Family:     Physician:  Geoffery Lyons 03/09/2013 9:50 AM   Nursing:    03/09/2013 9:50 AM   Clinical Social Worker Ronda Fairly 03/09/2013 9:50 AM   Other:  Harold Barban, RN 03/09/2013 9:50 AM   Other:  Serena Colonel, PA 03/09/2013 9:50 AM   Other:   03/09/2013 9:50 AM   Other:   03/09/2013 9:50 AM    Scribe for Treatment Team:   Carney Bern, LCSWA  03/09/2013 9:50 AM

## 2013-03-09 NOTE — Progress Notes (Signed)
Adult Psychoeducational Group Note  Date:  03/09/2013 Time: 10:00AM  Group Topic/Focus:  Relapse Prevention Planning:   The focus of this group is to define relapse and discuss the need for planning to combat relapse.  Participation Level:  Active  Participation Quality:  Appropriate, Attentive and Supportive  Affect:  Appropriate  Cognitive:  Alert and Appropriate  Insight: Appropriate  Engagement in Group:  Engaged and Supportive  Modes of Intervention:  Activity  Additional Comments:    Alvon, Nygaard 03/09/2013, 12:28 PM

## 2013-03-09 NOTE — [facility] Group Notes (Signed)
[facility] LCSW Aftercare Discharge Planning Group Note   03/09/2013  8:45 AM  Participation Quality:  Appropriated  Mood/Affect:  Appropriate  Depression Rating:  0  Anxiety Rating:  0  Thoughts of Suicide:  No Will you contract for safety?   NA  Current AVH:  No  Plan for Discharge/Comments:  Follow up with outpatient medication management and AA/NA  Transportation Means:  Family  Supports: Family  Donovon Micheletti, Julious Payer

## 2013-03-09 NOTE — Progress Notes (Signed)
[facility] MD Progress Note  03/09/2013 3:24 PM Nathaniel Bradley  MRN:  [id number] Subjective:  Admits he can tell how his life was out of control. He was drinking, having extreme mood swings (not all the time associated to his use) alienating all the people that care about him. He states that his head is clearer. He was very anxious about seeing his girlfriend as he felt she did not want anything to do with him. She wrote him a two page "love letter" He states he was very emotional as he did not think she was going to put on with the way he has been behaving. As he anticipates life out of here he is concerned about his job. 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   HENT: Negative.   Eyes: Negative.   Respiratory: Negative.   Cardiovascular: Negative.   Gastrointestinal: Negative.   Genitourinary: Negative.   Musculoskeletal: Negative.   Skin: Negative.   Neurological: Negative.   Endo/Heme/Allergies: Negative.   Psychiatric/Behavioral: Positive for depression and substance abuse. The patient is nervous/anxious.     Blood pressure 180/111, pulse 83, temperature 96.6 F (35.9 C), temperature source Oral, resp. rate 18, height 6\' 4"  (1.93 m), weight 131.543 kg (290 lb), SpO2 98.00%.Body mass index is 35.31 kg/(m^2).  General Appearance: Fairly Groomed  Patent attorney::  Fair  Speech:  Clear and Coherent  Volume:  Normal  Mood:  Anxious and worried  Affect:  Restricted  Thought Process:  Coherent and Goal Directed  Orientation:  Full (Time, Place, and Person)  Thought Content:   worries, concerns about his drinking his mood, wanting to be "normal"  Suicidal Thoughts:  No  Homicidal Thoughts:  No  Memory:  Immediate;   Fair Recent;   Fair Remote;   Fair  Judgement:  Fair  Insight:  Present  Psychomotor Activity:  Restlessness  Concentration:  Fair  Recall:  Fair  Akathisia:  No  Handed:  Right  AIMS (if indicated):     Assets:  Desire for Improvement Housing Social Support  Sleep:  Number of Hours: 5.75   Current Medications: Current Facility-Administered Medications  Medication Dose Route Frequency Provider Last Rate Last Dose  . acetaminophen (TYLENOL) tablet 650 mg  650 mg Oral Q6H PRN Shuvon Rankin, NP      . alum & mag hydroxide-simeth (MAALOX/MYLANTA) 200-200-20 MG/5ML suspension 30 mL  30 mL Oral Q4H PRN Shuvon Rankin, NP      . benazepril (LOTENSIN) tablet 10 mg  10 mg Oral Daily Sanjuana Kava, NP   10 mg at 03/09/13 0801  . carbamazepine (TEGRETOL XR) 12 hr tablet 200 mg  200 mg Oral BID Sanjuana Kava, NP   200 mg at 03/09/13 0801  . chlordiazePOXIDE (LIBRIUM) capsule 25 mg  25 mg Oral Q6H PRN Shuvon Rankin, NP      . chlordiazePOXIDE (LIBRIUM) capsule 25 mg  25 mg Oral TID Shuvon Rankin, NP       Followed by  . [START ON 03/10/2013] chlordiazePOXIDE (LIBRIUM) capsule 25 mg  25 mg Oral BH-qamhs Shuvon Rankin, NP       Followed by  . [START ON 03/11/2013] chlordiazePOXIDE (LIBRIUM) capsule 25 mg  25 mg Oral Daily Shuvon Rankin, NP      . hydrOXYzine (ATARAX/VISTARIL) tablet 25 mg  25 mg Oral Q6H PRN Shuvon Rankin, NP   25 mg at 03/08/13 0804  . loperamide (IMODIUM) capsule 2-4 mg  2-4 mg Oral PRN Shuvon Rankin, NP      . loratadine (CLARITIN) tablet 10 mg  10 mg Oral Daily Sanjuana Kava, NP   10 mg at 03/09/13 0801  . magnesium hydroxide (MILK OF MAGNESIA) suspension 30 mL  30 mL Oral Daily PRN Shuvon Rankin, NP      . multivitamin with minerals tablet 1 tablet  1 tablet Oral Daily Shuvon Rankin, NP   1 tablet at 03/09/13 0801  . naltrexone (DEPADE)  tablet 25 mg  25 mg Oral Daily Rachael Fee, MD      . ondansetron (ZOFRAN-ODT) disintegrating tablet 4 mg  4 mg Oral Q6H PRN Shuvon Rankin, NP   4 mg at 03/07/13 2126  . thiamine (VITAMIN B-1) tablet 100 mg  100 mg Oral Daily Shuvon Rankin, NP   100 mg at 03/09/13 0801  . traZODone (DESYREL) tablet 50 mg  50 mg Oral QHS PRN Shuvon Rankin, NP   50 mg at 03/08/13 2142    Lab Results: No results found for this or any previous visit (from the past 48 hour(s)).  Physical Findings: AIMS: Facial and Oral Movements Muscles of Facial Expression: None, normal Lips and Perioral Area: None, normal Jaw: None, normal Tongue: None, normal,Extremity Movements Upper (arms, wrists, hands, fingers): None, normal Lower (legs, knees, ankles, toes): None, normal, Trunk Movements Neck, shoulders, hips: None, normal, Overall Severity Severity of abnormal movements (highest score from questions above): None, normal Incapacitation due to abnormal movements: None, normal, Dental Status Current problems with teeth and/or dentures?: No Does patient usually wear dentures?: No  CIWA:  CIWA-Ar Total: 1 COWS:     Treatment Plan Summary: Daily contact with patient to assess and evaluate symptoms and progress in treatment Medication management  Plan: Supportive approach/coping skills/relapse prevention           Continue Tegretol           Add Naltrexone for the cravings           Anger manangement Medical Decision Making Problem Points:  Review of last therapy session (1) and Review of psycho-social stressors (1) Data Points:  Review of medication regiment & side effects (2) Review of new medications or change in dosage (2)  I certify that inpatient services furnished can reasonably be expected to improve the patient's condition.   Nayra Coury A 03/09/2013, 3:24 PM

## 2013-03-09 NOTE — Progress Notes (Signed)
[facility] Adult Case Management Discharge Plan :  Will you be returning to the same living situation after discharge: No. Patient will be going to parent's home.  At discharge, do you have transportation home?:Yes,  family or girlfriend Do you have the ability to pay for your medications:Yes,  insurance  Release of information consent forms completed and in the chart;  Patient's signature needed at discharge.  Patient to Follow up at: Follow-up Information   Call To Be Scheduled .   Contact information:   Unfortuantely offices were closed when we tried to schedule your followup Friday for Medication Management; You can call Santina Evans Monday afternoon at [phone] OR she will call you at [phone] with follow up details      Patient denies SI/HI:   Yes,  denies both     Safety Planning and Suicide Prevention discussed:  Yes,  with patient's mother  Clide Dales 03/09/2013, 4:24 PM

## 2013-03-09 NOTE — Progress Notes (Signed)
D patient states he slept well last nite, eating his meals in the DR and appetite is good, energy level is normal and ability to pay attention is good, depressed 1/10 and hopeless 1/10 today, voicing no concerns or WD s/s today, denies Si or HI and no AVH. Wants to get on with his life and getting himself going back in the community. Attending group and interacting w/peers on the unit. A q72min safety checks continue and support offered, encouraged to continue going to groups R patient remains safe on the unit

## 2013-03-09 NOTE — [facility] Suicide Risk Assessment (Signed)
[facility] INPATIENT:  Family/Significant Other Suicide Prevention Education  Suicide Prevention Education:  Education Completed; Nathaniel Bradley at [phone] has been identified by the patient as the family member/significant other with whom the patient will be residing, and identified as the person(s) who will aid the patient in the event of a mental health crisis (suicidal ideations/suicide attempt).  With written consent from the patient, the family member/significant other has been provided the following suicide prevention education, prior to the and/or following the discharge of the patient.  The suicide prevention education provided includes the following:  Suicide risk factors  Suicide prevention and interventions  National Suicide Hotline telephone number  [facility] assessment telephone number  [facility] Emergency Assistance 911  [facility] and/or Residential Mobile Crisis Unit telephone number  Request made of family/significant other to:  Remove weapons (e.g., guns, rifles, knives), all items previously/currently identified as safety concern.  Patient's mother reports all firearms at patient's marital home have been removed and secured. Also firearm in parental home, where patient will be residing, has been secured.   Remove drugs/medications (over-the-counter, prescriptions, illicit drugs), all items previously/currently identified as a safety concern.  The family member/significant other verbalizes understanding of the suicide prevention education information provided.  The family member/significant other agrees to remove the items of safety concern listed above.  Nathaniel Bradley 03/09/2013, 4:38 PM

## 2013-03-09 NOTE — Progress Notes (Signed)
Patient did not attend the evening karaoke group. Pt remained in bed during group.  

## 2013-03-10 DIAGNOSIS — F314 Bipolar disorder, current episode depressed, severe, without psychotic features: Secondary | ICD-10-CM

## 2013-03-10 DIAGNOSIS — F1020 Alcohol dependence, uncomplicated: Principal | ICD-10-CM

## 2013-03-10 MED ORDER — BENAZEPRIL HCL 10 MG PO TABS: 20.0000 mg | ORAL_TABLET | Freq: Every day | ORAL | Status: AC

## 2013-03-10 MED ORDER — TRAZODONE HCL 50 MG PO TABS: 50.0000 mg | ORAL_TABLET | Freq: Every evening | ORAL | Status: AC | PRN

## 2013-03-10 MED ORDER — ONE-DAILY MULTI VITAMINS PO TABS: 1.0000 | ORAL_TABLET | Freq: Every day | ORAL | Status: AC

## 2013-03-10 MED ORDER — NALTREXONE HCL 50 MG PO TABS: 25.0000 mg | ORAL_TABLET | Freq: Every day | ORAL | Status: AC

## 2013-03-10 MED ORDER — CARBAMAZEPINE ER 200 MG PO TB12: 200.0000 mg | ORAL_TABLET | Freq: Two times a day (BID) | ORAL | Status: AC

## 2013-03-10 NOTE — [facility] Group Notes (Signed)
[facility] Group Notes:  (Clinical Social Work)  03/10/2013     10-11AM  Summary of Progress/Problems:   The main focus of today's process group was for the patient to identify ways in which they have in the past sabotaged their own recovery. Motivational Interviewing was utilized to ask the group members what they get out of their substance use, and what reasons they may have for wanting to change.  The Stages of Change were explained using a handout, and patients identified where they currently are with regard to stages of change.  The patient expressed that any excuse is good enough for him to drink, and that he then continues to drink due to guilt feelings.  He has been diagnosed with Bipolar Disorder and had previously been given depression medications, which made things worse.  He wants to quit to have a "normal" life which he believes would be a better quality.  He stated he is in action stage of change, including coming to the hospital, admitting he has a problem, taking meds, swallowing his pride and returning home to live with mother.  He said he has spent the last 20 years in the contemplation stage of change.  Type of Therapy:  Group Therapy - Process   Participation Level:  Active  Participation Quality:  Appropriate, Attentive, Sharing and Supportive  Affect:  Appropriate  Cognitive:  Appropriate  Insight:  Engaged  Engagement in Therapy:  Engaged  Modes of Intervention:  Education, Support and Processing, Motivational Interviewing  Ambrose Mantle, LCSW 03/10/2013, 12:46 PM

## 2013-03-10 NOTE — Progress Notes (Signed)
Pt was discharged  Left with his mother  He had all follow up information and all belongings   He denies suicidal and homicidal ideation  His thinking is logical and goal directed and his speech is coherent  Pt verbalized understanding of medications and follow up information

## 2013-03-10 NOTE — [facility] Suicide Risk Assessment (Signed)
Suicide Risk Assessment  Discharge Assessment     Demographic Factors:  Male and Caucasian  Mental Status Per Nursing Assessment::   On Admission:  Self-harm thoughts  Current Mental Status by Physician: Patient is casually dressed and fairly groomed.  He denies any active or passive suicidal thoughts or homicidal thoughts.  He likes his medication.  He has no delusion paranoia or any obsessions.  Denies any auditory or visual hallucination.  He feels that the medication.  He has no tremors or shakes.  His speech is clear and coherent.  His thought process is logical linear and goal-directed.  He is alert and oriented x3.  His insight judgment and impulse control is okay.  He appears relevant to the conversation.  Loss Factors: NA  Historical Factors: Impulsivity  Risk Reduction Factors:   Positive social support, Positive therapeutic relationship and Positive coping skills or problem solving skills  Continued Clinical Symptoms:  Dysthymia Alcohol/Substance Abuse/Dependencies More than one psychiatric diagnosis  Cognitive Features That Contribute To Risk:  Polarized thinking    Suicide Risk:  Minimal: No identifiable suicidal ideation.  Patients presenting with no risk factors but with morbid ruminations; may be classified as minimal risk based on the severity of the depressive symptoms  Discharge Diagnoses:   AXIS I:  Alcohol Abuse AXIS II:  Deferred AXIS III:   Past Medical History  Diagnosis Date  . Asthma   . Depression   . Hyperlipidemia    AXIS IV:  other psychosocial or environmental problems AXIS V:  61-70 mild symptoms  Plan Of Care/Follow-up recommendations:  Activity:  As tolerated Diet:  Unchanged from the past Other:  Patient will call her as for followup appointment the writer.  Office closed and and at this time cannot make appointment.  Patient is aware about that.  Is patient on multiple antipsychotic therapies at discharge:  No   Has Patient had  three or more failed trials of antipsychotic monotherapy by history:  No  Recommended Plan for Multiple Antipsychotic Therapies: Taper to monotherapy as described:  And to followup for medication management.  Chauntel Windsor T. 03/10/2013, 11:35 AM

## 2013-03-10 NOTE — Discharge Summary (Signed)
Physician Discharge Summary Note  Patient:  Nathaniel Bradley is an 37 y.o., male MRN:  [id number] DOB:  10/04/1976 Patient phone:  [phone] (home)  Patient address:   [address] [place] [address],   Date of Admission:  03/07/2013 Date of Discharge: 05/31/014  Reason for Admission:  Increased alcohol consumption, suicidal ideations/threats  Discharge Diagnoses: Principal Problem:   Alcohol dependence Active Problems:   Bipolar affective disorder, depressed, severe degree, without mention of psychotic behavior   MDD (major depressive disorder), recurrent severe, without psychosis  Review of Systems  Constitutional: Negative.   HENT: Negative.   Eyes: Negative.   Respiratory: Negative.   Cardiovascular: Negative.   Gastrointestinal: Negative.   Genitourinary: Negative.   Musculoskeletal: Negative.   Skin: Negative.  Negative for itching and rash.       Bodily tattoos to arm areas  Neurological: Negative.   Endo/Heme/Allergies: Negative.   Psychiatric/Behavioral: Positive for depression (Stabilized with medication prior to discharge) and substance abuse (Hx alcohol dependence). Negative for suicidal ideas, hallucinations and memory loss. The patient is nervous/anxious (Stabilized with medication prior to discharge) and has insomnia (Stabilized with medication prior to discharge).    Axis Diagnosis:   AXIS I:  Alcohol dependence, Bipolar affective disorder, depressed, severe degree, without mention of psychotic behavior AXIS II:  Deferred AXIS III:   Past Medical History  Diagnosis Date  . Asthma   . Depression   . Hyperlipidemia    AXIS IV:  other psychosocial or environmental problems and Hx alcoholism AXIS V:  64  Level of Care:  OP  Hospital Course:  This is a 37 year old Caucasian male. Admitted to [facility] from the [facility] ED with complaints of increased depression, excessive alcohol consumption and constant suicidal thoughts. Patient reports, "My  mother took me to the hospital yesterday. I was having some issues. And while at the hospital yesterday, some doctors informed me that my symptoms sound more like bipolar disorder symptoms. The lower end of what I have been feeling is really bad. I have extreme feelings of ups and downs. One minute, I'm the life of a party, then the next minute, is an uncontrollable crying spells. This feelings has been going On x 10 years or more. I have taken medication here and there, but never felt any better. I had taken Zoloft, which made me feel worse. I'm currently taken Trazodone, which is making me feel very groggy in the mornings. 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 And because of the way that I have been feeling, I have not gone to work in 7 days. I have been drinking quite heavily 24-30 bottles of beer daily. I have constant thoughts of suicide. I remembered being in the [place] and feeling like my world was doomed and like life sucks. I have tried on several occasions by putting a gun in my mouth, but never pulled the trigger. I don't cut myself. In 2002, I got very depressed after getting out of the [employer], I threatened to kill myself with a gun. My parents freaked out and called the cops. I was taken to [facility] then. I had abuse cocaine and marijuana in past, but not recently. I only abuse alcohol now. I want get better. I have 3 beautiful daughters that I miss greatly".   Upon admission into this hospital, and after admission assessment/evaluation, it was determined that patient will need detoxification treatment to stabilize his systems of drug and to combat the withdrawal symptoms of  this substance as well. And his discharge plans included a referral/an appointment to an outpatient clinic for continuation of treatment. Mr. Witz was then started on Librium protocol for his alcohol detoxification. He was also enrolled in group counseling sessions and activities to learn coping skills that should help him after discharge to cope  better, manage his substance abuse problems to maintain a much longer sobriety. He also was enrolled and attended AA/NA meetings being offered and held on this unit. He some previously existing and or identifiable medical conditions that required treatment and or monitoring. He received medication management for those issues. He was monitored closely for any potential problems that may arise as a result of and or during detoxification treatment. Patient tolerated his treatment regimen and detoxification treatment without any significant adverse effects and or reactions.  Besides the detoxification treatment protocol received, Mr. Meacham also was treated for his mood disorder issues with; Tegretol XR 200 mg bid for mood stabilization, Naltrexone 50 mg for alcohol cravings and Trazodone 50 mg Q bedtime for sleep. Patient attended treatment team meeting this am and met with the team. His symptoms, substance abuse issues, response to treatment and discharge plans discussed. Patient endorsed that he is doing well and stable for discharge to pursue the next phase of his substance abuse treatment. It was decided that patient will continue psychiatric care on an outpatient basis. However, no follow-up appointment was made for patient prior to discharge because the outpatient psychiatric clinic office he was intended to go to was already closed for the day. The Social worker will call this clinic on Monday 03/12/13 for an appointment will communicate the date, time, address and contact information for this clinic with Mr. Mcclean. His Tegretol level upon discharge was 2.2. Although on the low levels, the reason being that patient has only been on this medication x 3 days. He still has more than enough time to take this medication to get the levels to within required normal levels. This will be monitored and titrated by his outpatient provider.  He was also encouraged to join/attend AA/NA meetings being offered and held  within his community. He is instructed and encouraged to get a trusted sponsor from the advise of others or from whomever within the AA meetings seems to make sense, and who has a proven track record, and will hold him responsible for his sobriety. Upon discharge, patient adamantly denies suicidal, homicidal ideations, auditory, visual hallucinations, delusional thinking and or withdrawal symptoms. Patient left [facility] with all personal belongings in no apparent distress. He received 4 days worth supply samples of his discharge medications. Transportation per family.   Consults:  psychiatry  Significant Diagnostic Studies:  labs: CBC with diff, CMP, UDS, Toxicology tests, U/A, Tegretol levels  Discharge Vitals:   Blood pressure 145/115, pulse 85, temperature 98 F (36.7 C), temperature source Oral, resp. rate 18, height 6\' 4"  (1.93 m), weight 131.543 kg (290 lb), SpO2 98.00%. Body mass index is 35.31 kg/(m^2). Lab Results:   Results for orders placed during the hospital encounter of 03/07/13 (from the past 72 hour(s))  CARBAMAZEPINE LEVEL, TOTAL     Status: Abnormal   Collection Time    03/10/13  6:36 AM      Result Value Range   Carbamazepine Lvl 2.2 (*) 4.0 - 12.0 ug/mL    Physical Findings: AIMS: Facial and Oral Movements Muscles of Facial Expression: None, normal Lips and Perioral Area: None, normal Jaw: None, normal Tongue: None, normal,Extremity Movements Upper (arms,  wrists, hands, fingers): None, normal Lower (legs, knees, ankles, toes): None, normal, Trunk Movements Neck, shoulders, hips: None, normal, Overall Severity Severity of abnormal movements (highest score from questions above): None, normal Incapacitation due to abnormal movements: None, normal, Dental Status Current problems with teeth and/or dentures?: No Does patient usually wear dentures?: No  CIWA:  CIWA-Ar Total: 0 COWS:     Psychiatric Specialty Exam: See Psychiatric Specialty Exam and Suicide Risk Assessment  completed by Attending Physician prior to discharge.  Discharge destination:  Home  Is patient on multiple antipsychotic therapies at discharge:  No   Has Patient had three or more failed trials of antipsychotic monotherapy by history:  No  Recommended Plan for Multiple Antipsychotic Therapies: NA     Medication List    STOP taking these medications       diazepam 10 MG tablet  Commonly known as:  VALIUM     fish oil-omega-3 fatty acids 1000 MG capsule     PROTEIN SUPPLEMENT 80% PO      TAKE these medications     Indication   benazepril 10 MG tablet  Commonly known as:  LOTENSIN  Take 2 tablets (20 mg total) by mouth daily. For high blood pressure control   Indication:  High Blood Pressure     carbamazepine 200 MG 12 hr tablet  Commonly known as:  TEGRETOL XR  Take 1 tablet (200 mg total) by mouth 2 (two) times daily. For mood stabilization   Indication:  Manic-Depression, For mood stabilization     multivitamin tablet  Take 1 tablet by mouth daily. For vitamin replacement   Indication:  Low vitamin     naltrexone 50 MG tablet  Commonly known as:  DEPADE  Take 0.5 tablets (25 mg total) by mouth daily. For alcoholism   Indication:  Excessive Use of Alcohol     traZODone 50 MG tablet  Commonly known as:  DESYREL  Take 1 tablet (50 mg total) by mouth at bedtime as needed for sleep.   Indication:  Trouble Sleeping       Follow-up Information   Call To Be Scheduled .   Contact information:   Unfortunately offices were closed when we tried to schedule your followup Friday for Medication Management; You can call Santina Evans Monday afternoon at [phone] OR she will call you at [phone] with follow up details     Follow-up recommendations: Activity:  As tolerated Diet: As recommended by your primary care doctor. Keep all scheduled follow-up appointments as recommended.   Comments:  Take all your medications as prescribed by your mental healthcare  provider. Report any adverse effects and or reactions from your medicines to your outpatient provider promptly. Patient is instructed and cautioned to not engage in alcohol and or illegal drug use while on prescription medicines. In the event of worsening symptoms, patient is instructed to call the crisis hotline, 911 and or go to the nearest ED for appropriate evaluation and treatment of symptoms. Follow-up with your primary care provider for your other medical issues, concerns and or health care needs.   Total Discharge Time:  Greater than 30 minutes.  Signed: Sanjuana Kava, PMHNP-BC 03/11/2013, 9:12 AM I agreed with the findings and discharge planning

## 2013-03-12 NOTE — Progress Notes (Addendum)
Patient Discharge Instructions:   No documentation sent.  Per the AVS no follow up was scheduled because the office was closed.  Follow up is to be scheduled.  Jerelene Redden, 03/12/2013, 4:02 PM

## 2013-03-19 ENCOUNTER — Encounter: Payer: Self-pay | Admitting: Family Medicine

## 2013-03-19 NOTE — Progress Notes (Signed)
Received some records from [facility].  Tx  fo anxiety in the past, frequent sinus issues, OSA - on CPAP in the past. Scanned in labs from 2011, PSG from 2011, EKG.

## 2013-03-20 NOTE — Progress Notes (Signed)
Patient Discharge Instructions:  After Visit Summary (AVS):   Faxed to:  03/20/13 Discharge Summary Note:   Faxed to:  03/20/13 Psychiatric Admission Assessment Note:   Faxed to:  03/20/13 Suicide Risk Assessment - Discharge Assessment:   Faxed to:  03/20/13 Faxed/Sent to the Next Level Care provider:  03/20/13 Faxed to [facility] @ [phone]  Jerelene Redden, 03/20/2013, 2:57 PM

## 2013-05-02 ENCOUNTER — Ambulatory Visit (HOSPITAL_COMMUNITY): Payer: Self-pay | Admitting: Physician Assistant

## 2013-07-05 ENCOUNTER — Encounter: Payer: Self-pay | Admitting: Cardiology

## 2014-04-28 ENCOUNTER — Emergency Department (HOSPITAL_BASED_OUTPATIENT_CLINIC_OR_DEPARTMENT_OTHER)
Admission: EM | Admit: 2014-04-28 | Discharge: 2014-04-28 | Disposition: A | Discharge status: Home / Self Care | Payer: BC Managed Care – PPO | Attending: Emergency Medicine | Admitting: Emergency Medicine

## 2014-04-28 ENCOUNTER — Encounter (HOSPITAL_BASED_OUTPATIENT_CLINIC_OR_DEPARTMENT_OTHER): Payer: Self-pay | Admitting: Emergency Medicine

## 2014-04-28 DIAGNOSIS — J45909 Unspecified asthma, uncomplicated: Secondary | ICD-10-CM | POA: Insufficient documentation

## 2014-04-28 DIAGNOSIS — N289 Disorder of kidney and ureter, unspecified: Secondary | ICD-10-CM | POA: Insufficient documentation

## 2014-04-28 DIAGNOSIS — F3289 Other specified depressive episodes: Secondary | ICD-10-CM | POA: Insufficient documentation

## 2014-04-28 DIAGNOSIS — R748 Abnormal levels of other serum enzymes: Secondary | ICD-10-CM | POA: Insufficient documentation

## 2014-04-28 DIAGNOSIS — R000 Tachycardia, unspecified: Secondary | ICD-10-CM | POA: Insufficient documentation

## 2014-04-28 DIAGNOSIS — N390 Urinary tract infection, site not specified: Secondary | ICD-10-CM | POA: Insufficient documentation

## 2014-04-28 DIAGNOSIS — Z79899 Other long term (current) drug therapy: Secondary | ICD-10-CM | POA: Insufficient documentation

## 2014-04-28 DIAGNOSIS — E860 Dehydration: Secondary | ICD-10-CM | POA: Insufficient documentation

## 2014-04-28 DIAGNOSIS — Z8639 Personal history of other endocrine, nutritional and metabolic disease: Secondary | ICD-10-CM | POA: Insufficient documentation

## 2014-04-28 DIAGNOSIS — R197 Diarrhea, unspecified: Secondary | ICD-10-CM | POA: Insufficient documentation

## 2014-04-28 DIAGNOSIS — F329 Major depressive disorder, single episode, unspecified: Secondary | ICD-10-CM | POA: Insufficient documentation

## 2014-04-28 DIAGNOSIS — Z862 Personal history of diseases of the blood and blood-forming organs and certain disorders involving the immune mechanism: Secondary | ICD-10-CM | POA: Insufficient documentation

## 2014-04-28 DIAGNOSIS — R1110 Vomiting, unspecified: Secondary | ICD-10-CM | POA: Insufficient documentation

## 2014-04-28 LAB — URINALYSIS, ROUTINE W REFLEX MICROSCOPIC
GLUCOSE, UA: NEGATIVE mg/dL
Glucose, UA: NEGATIVE mg/dL
HGB URINE DIPSTICK: NEGATIVE
Hgb urine dipstick: NEGATIVE
Ketones, ur: 15 mg/dL — AB
Ketones, ur: 15 mg/dL — AB
LEUKOCYTES UA: NEGATIVE
Leukocytes, UA: NEGATIVE
NITRITE: NEGATIVE
Nitrite: NEGATIVE
PH: 6 (ref 5.0–8.0)
Protein, ur: 100 mg/dL — AB
Protein, ur: 30 mg/dL — AB
SPECIFIC GRAVITY, URINE: 1.03 (ref 1.005–1.030)
SPECIFIC GRAVITY, URINE: 1.029 (ref 1.005–1.030)
UROBILINOGEN UA: 0.2 mg/dL (ref 0.0–1.0)
Urobilinogen, UA: 0.2 mg/dL (ref 0.0–1.0)
pH: 6 (ref 5.0–8.0)

## 2014-04-28 LAB — COMPREHENSIVE METABOLIC PANEL
ALBUMIN: 2.7 g/dL — AB (ref 3.5–5.2)
ALK PHOS: 40 U/L (ref 39–117)
ALT: 57 U/L — ABNORMAL HIGH (ref 0–53)
ANION GAP: 7 (ref 5–15)
AST: 45 U/L — ABNORMAL HIGH (ref 0–37)
BILIRUBIN TOTAL: 0.2 mg/dL — AB (ref 0.3–1.2)
BUN: 18 mg/dL (ref 6–23)
CHLORIDE: 100 meq/L (ref 96–112)
CO2: 29 meq/L (ref 19–32)
CREATININE: 1.8 mg/dL — AB (ref 0.50–1.35)
Calcium: 8.2 mg/dL — ABNORMAL LOW (ref 8.4–10.5)
GFR calc Af Amer: 53 mL/min — ABNORMAL LOW (ref >90)
GFR, EST NON AFRICAN AMERICAN: 46 mL/min — AB (ref >90)
Glucose, Bld: 124 mg/dL — ABNORMAL HIGH (ref 70–99)
POTASSIUM: 4.2 meq/L (ref 3.7–5.3)
Sodium: 136 mEq/L — ABNORMAL LOW (ref 137–147)
Total Protein: 5.9 g/dL — ABNORMAL LOW (ref 6.0–8.3)

## 2014-04-28 LAB — URINE MICROSCOPIC-ADD ON

## 2014-04-28 LAB — CBC
HEMATOCRIT: 53.6 % — AB (ref 39.0–52.0)
Hemoglobin: 19.2 g/dL — ABNORMAL HIGH (ref 13.0–17.0)
MCH: 30 pg (ref 26.0–34.0)
MCHC: 35.8 g/dL (ref 30.0–36.0)
MCV: 83.9 fL (ref 78.0–100.0)
Platelets: 226 10*3/uL (ref 150–400)
RBC: 6.39 MIL/uL — AB (ref 4.22–5.81)
RDW: 15.7 % — AB (ref 11.5–15.5)
WBC: 5.3 10*3/uL (ref 4.0–10.5)

## 2014-04-28 MED ORDER — CEPHALEXIN 500 MG PO CAPS: ORAL_CAPSULE | ORAL | Status: AC

## 2014-04-28 MED ORDER — ONDANSETRON HCL 4 MG/2ML IJ SOLN
4.0000 mg | Freq: Once | INTRAMUSCULAR | Status: AC
  Administered 2014-04-28: 4 mg via INTRAVENOUS
  Filled 2014-04-28: qty 2

## 2014-04-28 MED ORDER — METOCLOPRAMIDE HCL 10 MG PO TABS: 10.0000 mg | ORAL_TABLET | Freq: Four times a day (QID) | ORAL | Status: AC | PRN

## 2014-04-28 MED ORDER — SODIUM CHLORIDE 0.9 % IV BOLUS (SEPSIS)
1000.0000 mL | Freq: Once | INTRAVENOUS | Status: AC
  Administered 2014-04-28: 1000 mL via INTRAVENOUS

## 2014-04-28 MED ORDER — SODIUM CHLORIDE 0.9 % IV SOLN: Freq: Once | INTRAVENOUS | Status: AC

## 2014-04-28 MED ORDER — ONDANSETRON 8 MG PO TBDP
ORAL_TABLET | ORAL | Status: AC
Start: 2014-04-28 — End: ?

## 2014-04-28 MED ORDER — LOPERAMIDE HCL 2 MG PO CAPS: ORAL_CAPSULE | ORAL | Status: AC

## 2014-04-28 MED ORDER — SODIUM CHLORIDE 0.9 % IV BOLUS (SEPSIS)
2000.0000 mL | Freq: Once | INTRAVENOUS | Status: AC
  Administered 2014-04-28: 2000 mL via INTRAVENOUS

## 2014-04-28 NOTE — ED Notes (Signed)
Assumed care of patient from Kristen, RN.

## 2014-04-28 NOTE — ED Notes (Signed)
Pt presents to ED with complaints of vomiting and diarrhea since Friday. Pt states he has lost 15 pounds since Friday.

## 2014-04-28 NOTE — Discharge Instructions (Signed)

## 2014-04-28 NOTE — ED Provider Notes (Signed)
CSN: [id number]     Arrival date & time 04/28/14  1546 History  This chart was scribed for Hurman Horn, MD by Milly Jakob, ED Scribe. The patient was seen in room MH09/MH09. Patient's care was started at 4:25 PM.     Chief Complaint  Patient presents with  . Emesis  . Diarrhea    The history is provided by the patient. No language interpreter was used.   HPI Comments: Nathaniel Homan. is a 38 y.o. male who presents to the Emergency Department complaining of nausea, emesis, and diarrhea onset several days ago and worsened 3 days ago. He states that he had mild diarrhea for the past several days before the other symptoms began. He reports associated subjective fever and severe headache yesterday but not today. He reports dysuria and intermittent abdominal pain. He denies headache today. He denies hematochezia, hematemesis, and confusion. He denies history of abdominal surgery. Denies testicular pain.  Past Medical History  Diagnosis Date  . Asthma   . Depression   . Hyperlipidemia    Past Surgical History  Procedure Laterality Date  . Torn biceps      surgical repair  . Mrsa      had surgery 4 to 5 yrs ago   . Nasal septum surgery  08/2011   Family History  Problem Relation Age of Onset  . Hypertension Father   . Alcohol abuse Father   . Arthritis Mother   . Lung cancer Maternal Grandfather   . Heart disease Maternal Grandmother   . Hypertension Paternal Grandfather   . Alcohol abuse      all grandparents    History  Substance Use Topics  . Smoking status: Never Smoker   . Smokeless tobacco: Not on file  . Alcohol Use: Yes     Comment: trying to stop drinking daily, estimated 30- beers per day    Review of Systems  10 Systems reviewed and are negative for acute change except as noted in the HPI.   Allergies  Review of patient's allergies indicates no known allergies.  Home Medications   Prior to Admission medications   Medication Sig Start Date End  Date Taking? Authorizing Provider  benazepril (LOTENSIN) 10 MG tablet Take 2 tablets (20 mg total) by mouth daily. For high blood pressure control 03/10/13   Sanjuana Kava, NP  carbamazepine (TEGRETOL XR) 200 MG 12 hr tablet Take 1 tablet (200 mg total) by mouth 2 (two) times daily. For mood stabilization 03/10/13   Sanjuana Kava, NP  cephALEXin (KEFLEX) 500 MG capsule 2 caps po bid x 7 days 04/28/14   Hurman Horn, MD  loperamide (IMODIUM) 2 MG capsule Take two tabs po initially, then one tab after each loose stool: max 8 tabs in 24 hours 04/28/14   Hurman Horn, MD  metoCLOPramide (REGLAN) 10 MG tablet Take 1 tablet (10 mg total) by mouth every 6 (six) hours as needed for nausea (nausea/headache). 04/28/14   Hurman Horn, MD  Multiple Vitamin (MULTIVITAMIN) tablet Take 1 tablet by mouth daily. For vitamin replacement 03/10/13   Sanjuana Kava, NP  naltrexone (DEPADE) 50 MG tablet Take 0.5 tablets (25 mg total) by mouth daily. For alcoholism 03/10/13   Sanjuana Kava, NP  ondansetron (ZOFRAN ODT) 8 MG disintegrating tablet 8mg  ODT q4 hours prn nausea 04/28/14   Hurman Horn, MD  traZODone (DESYREL) 50 MG tablet Take 1 tablet (50 mg total) by mouth at  bedtime as needed for sleep. 03/10/13   Sanjuana KavaAgnes I Nwoko, NP   Triage Vitals: BP 132/80  Pulse 95  Temp(Src) 98 F (36.7 C) (Oral)  Resp 18  Ht 6\' 5"  (1.956 m)  Wt 285 lb (129.275 kg)  BMI 33.79 kg/m2  SpO2 94% Physical Exam  Nursing note and vitals reviewed. Constitutional:  Awake, alert, nontoxic appearance.  HENT:  Head: Atraumatic.  Eyes: Right eye exhibits no discharge. Left eye exhibits no discharge.  Neck: Neck supple.  Cardiovascular: Regular rhythm.   No murmur heard. Tachycardia.   Pulmonary/Chest: Effort normal and breath sounds normal. No respiratory distress. He has no wheezes. He has no rales. He exhibits no tenderness.  Abdominal: Soft. He exhibits no distension. There is tenderness (Minimal, Diffuse). There is no rebound and no  guarding.  Genitourinary:  No testicular pain.  Musculoskeletal: He exhibits no tenderness.  Baseline ROM, no obvious new focal weakness.  Neurological:  Mental status and motor strength appears baseline for patient and situation.  Skin: No rash noted.  Psychiatric: He has a normal mood and affect.    ED Course  Procedures (including critical care time) Patient / Family / Caregiver understand and agree with initial ED impression and plan with expectations set for ED visit.  Patient / Family / Caregiver informed of clinical course, understand medical decision-making process, and agree with plan.Pt feels improved after observation and/or treatment in ED.  Labs Review Labs Reviewed  CBC - Abnormal; Notable for the following:    RBC 6.39 (*)    Hemoglobin 19.2 (*)    HCT 53.6 (*)    RDW 15.7 (*)    All other components within normal limits  URINALYSIS, ROUTINE W REFLEX MICROSCOPIC - Abnormal; Notable for the following:    Color, Urine AMBER (*)    Bilirubin Urine SMALL (*)    Ketones, ur 15 (*)    Protein, ur 100 (*)    All other components within normal limits  COMPREHENSIVE METABOLIC PANEL - Abnormal; Notable for the following:    Sodium 136 (*)    Glucose, Bld 124 (*)    Creatinine, Ser 1.80 (*)    Calcium 8.2 (*)    Total Protein 5.9 (*)    Albumin 2.7 (*)    AST 45 (*)    ALT 57 (*)    Total Bilirubin 0.2 (*)    GFR calc non Af Amer 46 (*)    GFR calc Af Amer 53 (*)    All other components within normal limits  URINE MICROSCOPIC-ADD ON - Abnormal; Notable for the following:    Bacteria, UA MANY (*)    Casts HYALINE CASTS (*)    All other components within normal limits  URINALYSIS, ROUTINE W REFLEX MICROSCOPIC - Abnormal; Notable for the following:    Bilirubin Urine SMALL (*)    Ketones, ur 15 (*)    Protein, ur 30 (*)    All other components within normal limits  URINE MICROSCOPIC-ADD ON - Abnormal; Notable for the following:    Bacteria, UA MANY (*)    Casts  HYALINE CASTS (*)    All other components within normal limits  URINE CULTURE    Imaging Review No results found.   EKG Interpretation None      MDM   Final diagnoses:  Vomiting and diarrhea  Dehydration  Renal insufficiency  Elevated liver enzymes  UTI (lower urinary tract infection)    I doubt any other EMC precluding discharge at  this time including, but not necessarily limited to the following:sepsis, peritonitis.  I personally performed the services described in this documentation, which was scribed in my presence. The recorded information has been reviewed and is accurate.    Hurman Horn, MD 04/29/14 2111

## 2014-04-28 NOTE — ED Notes (Signed)
MD at bedside. 

## 2014-04-30 LAB — URINE CULTURE
Colony Count: NO GROWTH
Culture: NO GROWTH

## 2015-03-18 ENCOUNTER — Encounter: Payer: Self-pay | Admitting: Family Medicine

## 2015-03-18 ENCOUNTER — Ambulatory Visit (INDEPENDENT_AMBULATORY_CARE_PROVIDER_SITE_OTHER): Payer: Self-pay | Admitting: Family Medicine

## 2015-03-18 DIAGNOSIS — R69 Illness, unspecified: Secondary | ICD-10-CM

## 2015-03-18 NOTE — Progress Notes (Signed)
NO SHOW

## 2015-03-24 ENCOUNTER — Encounter: Payer: Self-pay | Admitting: Family Medicine

## 2015-04-01 ENCOUNTER — Other Ambulatory Visit (HOSPITAL_COMMUNITY): Payer: Self-pay

## 2015-04-01 ENCOUNTER — Ambulatory Visit (HOSPITAL_COMMUNITY)
Admission: RE | Admit: 2015-04-01 | Discharge: 2015-04-01 | Disposition: A | Discharge status: Home / Self Care | Payer: BLUE CROSS/BLUE SHIELD | Source: Ambulatory Visit | Attending: Family Medicine | Admitting: Family Medicine

## 2015-04-01 DIAGNOSIS — W3400XA Accidental discharge from unspecified firearms or gun, initial encounter: Secondary | ICD-10-CM | POA: Insufficient documentation

## 2015-04-01 DIAGNOSIS — Z1810 Retained metal fragments, unspecified: Secondary | ICD-10-CM | POA: Diagnosis not present

## 2015-04-01 DIAGNOSIS — S098XXA Other specified injuries of head, initial encounter: Secondary | ICD-10-CM | POA: Insufficient documentation

## 2015-04-11 DEATH — deceased
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
